# Patient Record
Sex: Male | Born: 1935 | Race: White | Hispanic: No | Marital: Single | Smoking: Never smoker
Health system: Southern US, Community
[De-identification: ages and names within clinical notes are randomized; demographics above are authoritative.]

## PROBLEM LIST (undated history)

## (undated) DIAGNOSIS — C801 Malignant (primary) neoplasm, unspecified: Secondary | ICD-10-CM

## (undated) DIAGNOSIS — I219 Acute myocardial infarction, unspecified: Secondary | ICD-10-CM

---

## 1999-04-28 ENCOUNTER — Ambulatory Visit (HOSPITAL_COMMUNITY): Admission: RE | Admit: 1999-04-28 | Discharge: 1999-04-28 | Payer: Self-pay | Admitting: Surgery

## 1999-04-28 ENCOUNTER — Encounter: Payer: Self-pay | Admitting: Surgery

## 1999-10-25 ENCOUNTER — Ambulatory Visit (HOSPITAL_COMMUNITY): Admission: RE | Admit: 1999-10-25 | Discharge: 1999-10-25 | Payer: Self-pay | Admitting: Surgery

## 1999-12-02 ENCOUNTER — Encounter: Payer: Self-pay | Admitting: Surgery

## 1999-12-03 ENCOUNTER — Observation Stay (HOSPITAL_COMMUNITY): Admission: RE | Admit: 1999-12-03 | Discharge: 1999-12-05 | Payer: Self-pay | Admitting: Surgery

## 2000-01-18 ENCOUNTER — Encounter: Payer: Self-pay | Admitting: *Deleted

## 2000-01-18 ENCOUNTER — Ambulatory Visit (HOSPITAL_COMMUNITY): Admission: RE | Admit: 2000-01-18 | Discharge: 2000-01-18 | Payer: Self-pay | Admitting: *Deleted

## 2002-09-29 ENCOUNTER — Encounter: Payer: Self-pay | Admitting: Emergency Medicine

## 2002-09-29 ENCOUNTER — Emergency Department (HOSPITAL_COMMUNITY): Admission: EM | Admit: 2002-09-29 | Discharge: 2002-09-29 | Payer: Self-pay | Admitting: Emergency Medicine

## 2002-09-30 ENCOUNTER — Emergency Department (HOSPITAL_COMMUNITY): Admission: EM | Admit: 2002-09-30 | Discharge: 2002-09-30 | Payer: Self-pay

## 2004-08-24 ENCOUNTER — Encounter: Payer: Self-pay | Admitting: Internal Medicine

## 2004-09-07 ENCOUNTER — Encounter: Payer: Self-pay | Admitting: Internal Medicine

## 2005-05-05 ENCOUNTER — Ambulatory Visit: Payer: Self-pay | Admitting: Gastroenterology

## 2005-05-26 ENCOUNTER — Ambulatory Visit: Payer: Self-pay | Admitting: Gastroenterology

## 2005-06-17 ENCOUNTER — Ambulatory Visit: Payer: Self-pay

## 2005-07-01 ENCOUNTER — Ambulatory Visit: Payer: Self-pay | Admitting: Surgery

## 2005-07-05 ENCOUNTER — Encounter: Admission: RE | Admit: 2005-07-05 | Discharge: 2005-07-05 | Payer: Self-pay | Admitting: Surgery

## 2005-07-06 ENCOUNTER — Emergency Department: Payer: Self-pay | Admitting: Emergency Medicine

## 2005-10-24 ENCOUNTER — Inpatient Hospital Stay: Payer: Self-pay | Admitting: Internal Medicine

## 2007-01-01 ENCOUNTER — Ambulatory Visit: Payer: Self-pay | Admitting: Internal Medicine

## 2007-04-04 ENCOUNTER — Encounter: Payer: Self-pay | Admitting: Cardiology

## 2007-04-08 ENCOUNTER — Encounter: Payer: Self-pay | Admitting: Cardiology

## 2007-05-08 ENCOUNTER — Encounter: Payer: Self-pay | Admitting: Cardiology

## 2007-06-08 ENCOUNTER — Encounter: Payer: Self-pay | Admitting: Cardiology

## 2007-07-09 ENCOUNTER — Encounter: Payer: Self-pay | Admitting: Cardiology

## 2007-10-20 ENCOUNTER — Other Ambulatory Visit: Payer: Self-pay

## 2007-10-20 ENCOUNTER — Inpatient Hospital Stay: Payer: Self-pay | Admitting: Internal Medicine

## 2007-11-16 ENCOUNTER — Ambulatory Visit: Payer: Self-pay | Admitting: Urology

## 2007-11-21 ENCOUNTER — Ambulatory Visit: Payer: Self-pay | Admitting: Urology

## 2009-05-06 ENCOUNTER — Ambulatory Visit: Payer: Self-pay | Admitting: Unknown Physician Specialty

## 2009-12-17 ENCOUNTER — Inpatient Hospital Stay: Payer: Self-pay | Admitting: Internal Medicine

## 2009-12-22 ENCOUNTER — Encounter: Payer: Self-pay | Admitting: Internal Medicine

## 2010-02-04 ENCOUNTER — Encounter: Payer: Self-pay | Admitting: Internal Medicine

## 2010-02-05 ENCOUNTER — Encounter: Payer: Self-pay | Admitting: Internal Medicine

## 2010-03-07 ENCOUNTER — Encounter: Payer: Self-pay | Admitting: Internal Medicine

## 2010-04-07 ENCOUNTER — Encounter: Payer: Self-pay | Admitting: Internal Medicine

## 2010-06-11 ENCOUNTER — Ambulatory Visit: Payer: Self-pay | Admitting: Physician Assistant

## 2010-11-19 ENCOUNTER — Ambulatory Visit: Payer: Self-pay | Admitting: Internal Medicine

## 2010-11-28 ENCOUNTER — Encounter: Payer: Self-pay | Admitting: Surgery

## 2011-03-02 ENCOUNTER — Observation Stay: Payer: Self-pay | Admitting: Internal Medicine

## 2011-08-10 ENCOUNTER — Ambulatory Visit: Payer: Self-pay | Admitting: Gastroenterology

## 2011-10-03 ENCOUNTER — Inpatient Hospital Stay: Payer: Self-pay | Admitting: Internal Medicine

## 2012-02-02 ENCOUNTER — Ambulatory Visit: Payer: Self-pay | Admitting: Neurosurgery

## 2012-05-20 ENCOUNTER — Emergency Department: Payer: Self-pay | Admitting: Emergency Medicine

## 2012-05-20 LAB — URINALYSIS, COMPLETE
Bacteria: NONE SEEN
Bilirubin,UR: NEGATIVE
Blood: NEGATIVE
Glucose,UR: NEGATIVE mg/dL (ref 0–75)
Hyaline Cast: 3
Ketone: NEGATIVE
Specific Gravity: 1.009 (ref 1.003–1.030)
Squamous Epithelial: <1

## 2012-05-20 LAB — COMPREHENSIVE METABOLIC PANEL
Albumin: 3.2 g/dL — ABNORMAL LOW (ref 3.4–5.0)
Alkaline Phosphatase: 58 U/L (ref 50–136)
Anion Gap: 10 (ref 7–16)
BUN: 11 mg/dL (ref 7–18)
Bilirubin,Total: 0.7 mg/dL (ref 0.2–1.0)
Calcium, Total: 8.6 mg/dL (ref 8.5–10.1)
Chloride: 103 mmol/L (ref 98–107)
Co2: 26 mmol/L (ref 21–32)
Creatinine: 0.92 mg/dL (ref 0.60–1.30)
EGFR (African American): >60
EGFR (Non-African Amer.): >60
Glucose: 115 mg/dL — ABNORMAL HIGH (ref 65–99)
Osmolality: 278 (ref 275–301)
Potassium: 4 mmol/L (ref 3.5–5.1)
SGOT(AST): 20 U/L (ref 15–37)
SGPT (ALT): 14 U/L
Sodium: 139 mmol/L (ref 136–145)
Total Protein: 6.5 g/dL (ref 6.4–8.2)

## 2012-05-20 LAB — CBC
HCT: 33.2 % — ABNORMAL LOW (ref 40.0–52.0)
HGB: 11.3 g/dL — ABNORMAL LOW (ref 13.0–18.0)
MCH: 33 pg (ref 26.0–34.0)
MCHC: 34 g/dL (ref 32.0–36.0)
MCV: 97 fL (ref 80–100)
Platelet: 155 10*3/uL (ref 150–440)
RBC: 3.42 10*6/uL — ABNORMAL LOW (ref 4.40–5.90)
RDW: 14.1 % (ref 11.5–14.5)
WBC: 5.9 10*3/uL (ref 3.8–10.6)

## 2012-05-20 LAB — TROPONIN I: Troponin-I: <0.02 ng/mL

## 2012-05-20 LAB — CK TOTAL AND CKMB (NOT AT ARMC)
CK, Total: 60 U/L (ref 35–232)
CK-MB: 1.3 ng/mL (ref 0.5–3.6)

## 2012-06-06 ENCOUNTER — Emergency Department: Payer: Self-pay | Admitting: Emergency Medicine

## 2012-06-06 LAB — TROPONIN I
Troponin-I: <0.02 ng/mL
Troponin-I: <0.02 ng/mL

## 2012-06-06 LAB — CBC WITH DIFFERENTIAL/PLATELET
Basophil #: 0 10*3/uL (ref 0.0–0.1)
Eosinophil #: 0.1 10*3/uL (ref 0.0–0.7)
Eosinophil %: 1.1 %
Lymphocyte #: 2.5 10*3/uL (ref 1.0–3.6)
MCH: 33.3 pg (ref 26.0–34.0)
MCV: 96 fL (ref 80–100)
Monocyte #: 0.4 x10 3/mm (ref 0.2–1.0)
Neutrophil #: 3.7 10*3/uL (ref 1.4–6.5)
Platelet: 171 10*3/uL (ref 150–440)
RDW: 14.7 % — ABNORMAL HIGH (ref 11.5–14.5)

## 2012-06-06 LAB — COMPREHENSIVE METABOLIC PANEL
Albumin: 3.5 g/dL (ref 3.4–5.0)
Alkaline Phosphatase: 57 U/L (ref 50–136)
BUN: 13 mg/dL (ref 7–18)
Calcium, Total: 8.9 mg/dL (ref 8.5–10.1)
Co2: 26 mmol/L (ref 21–32)
EGFR (African American): >60
EGFR (Non-African Amer.): >60
Glucose: 111 mg/dL — ABNORMAL HIGH (ref 65–99)
Potassium: 3.9 mmol/L (ref 3.5–5.1)
SGOT(AST): 20 U/L (ref 15–37)
SGPT (ALT): 15 U/L
Total Protein: 7 g/dL (ref 6.4–8.2)

## 2012-06-06 LAB — TSH: Thyroid Stimulating Horm: 2.73 u[IU]/mL

## 2012-08-14 ENCOUNTER — Ambulatory Visit: Payer: Self-pay | Admitting: Internal Medicine

## 2012-08-16 ENCOUNTER — Ambulatory Visit: Payer: Self-pay | Admitting: Internal Medicine

## 2015-09-14 ENCOUNTER — Emergency Department: Payer: Medicare PPO

## 2015-09-14 ENCOUNTER — Encounter: Payer: Self-pay | Admitting: *Deleted

## 2015-09-14 ENCOUNTER — Inpatient Hospital Stay: Payer: Medicare PPO

## 2015-09-14 ENCOUNTER — Inpatient Hospital Stay
Admission: EM | Admit: 2015-09-14 | Discharge: 2015-09-16 | DRG: 312 | Disposition: A | Discharge status: Home / Self Care | Payer: Medicare PPO | Attending: Internal Medicine | Admitting: Internal Medicine

## 2015-09-14 DIAGNOSIS — J189 Pneumonia, unspecified organism: Secondary | ICD-10-CM | POA: Diagnosis present

## 2015-09-14 DIAGNOSIS — Y92009 Unspecified place in unspecified non-institutional (private) residence as the place of occurrence of the external cause: Secondary | ICD-10-CM | POA: Diagnosis not present

## 2015-09-14 DIAGNOSIS — N400 Enlarged prostate without lower urinary tract symptoms: Secondary | ICD-10-CM | POA: Diagnosis present

## 2015-09-14 DIAGNOSIS — W19XXXA Unspecified fall, initial encounter: Secondary | ICD-10-CM | POA: Diagnosis present

## 2015-09-14 DIAGNOSIS — I252 Old myocardial infarction: Secondary | ICD-10-CM | POA: Diagnosis not present

## 2015-09-14 DIAGNOSIS — Z66 Do not resuscitate: Secondary | ICD-10-CM | POA: Diagnosis present

## 2015-09-14 DIAGNOSIS — J849 Interstitial pulmonary disease, unspecified: Secondary | ICD-10-CM | POA: Diagnosis present

## 2015-09-14 DIAGNOSIS — E860 Dehydration: Secondary | ICD-10-CM | POA: Diagnosis present

## 2015-09-14 DIAGNOSIS — Z7982 Long term (current) use of aspirin: Secondary | ICD-10-CM | POA: Diagnosis not present

## 2015-09-14 DIAGNOSIS — R55 Syncope and collapse: Secondary | ICD-10-CM | POA: Diagnosis present

## 2015-09-14 DIAGNOSIS — Z8546 Personal history of malignant neoplasm of prostate: Secondary | ICD-10-CM | POA: Diagnosis not present

## 2015-09-14 DIAGNOSIS — E538 Deficiency of other specified B group vitamins: Secondary | ICD-10-CM | POA: Diagnosis present

## 2015-09-14 DIAGNOSIS — R079 Chest pain, unspecified: Secondary | ICD-10-CM

## 2015-09-14 DIAGNOSIS — E785 Hyperlipidemia, unspecified: Secondary | ICD-10-CM | POA: Diagnosis present

## 2015-09-14 DIAGNOSIS — E871 Hypo-osmolality and hyponatremia: Secondary | ICD-10-CM | POA: Diagnosis present

## 2015-09-14 DIAGNOSIS — Z8673 Personal history of transient ischemic attack (TIA), and cerebral infarction without residual deficits: Secondary | ICD-10-CM

## 2015-09-14 DIAGNOSIS — I10 Essential (primary) hypertension: Secondary | ICD-10-CM | POA: Diagnosis present

## 2015-09-14 DIAGNOSIS — Z79899 Other long term (current) drug therapy: Secondary | ICD-10-CM

## 2015-09-14 DIAGNOSIS — H9190 Unspecified hearing loss, unspecified ear: Secondary | ICD-10-CM | POA: Diagnosis present

## 2015-09-14 HISTORY — DX: Malignant (primary) neoplasm, unspecified: C80.1

## 2015-09-14 HISTORY — DX: Acute myocardial infarction, unspecified: I21.9

## 2015-09-14 LAB — TROPONIN I: Troponin I: <0.03 ng/mL (ref <0.031)

## 2015-09-14 LAB — COMPREHENSIVE METABOLIC PANEL
ALBUMIN: 3.1 g/dL — AB (ref 3.5–5.0)
ALT: 14 U/L — AB (ref 17–63)
AST: 35 U/L (ref 15–41)
Alkaline Phosphatase: 37 U/L — ABNORMAL LOW (ref 38–126)
Anion gap: 5 (ref 5–15)
BILIRUBIN TOTAL: 0.8 mg/dL (ref 0.3–1.2)
BUN: 12 mg/dL (ref 6–20)
CHLORIDE: 101 mmol/L (ref 101–111)
CO2: 26 mmol/L (ref 22–32)
CREATININE: 0.79 mg/dL (ref 0.61–1.24)
Calcium: 8.5 mg/dL — ABNORMAL LOW (ref 8.9–10.3)
GFR calc Af Amer: >60 mL/min (ref >60)
GFR calc non Af Amer: >60 mL/min (ref >60)
GLUCOSE: 169 mg/dL — AB (ref 65–99)
POTASSIUM: 4.1 mmol/L (ref 3.5–5.1)
Sodium: 132 mmol/L — ABNORMAL LOW (ref 135–145)
TOTAL PROTEIN: 6.1 g/dL — AB (ref 6.5–8.1)

## 2015-09-14 LAB — PROTIME-INR
INR: 1.12
Prothrombin Time: 14.6 seconds (ref 11.4–15.0)

## 2015-09-14 LAB — CREATININE, SERUM: CREATININE: 0.74 mg/dL (ref 0.61–1.24)

## 2015-09-14 LAB — CBC
HCT: 32.9 % — ABNORMAL LOW (ref 40.0–52.0)
HCT: 31.8 % — ABNORMAL LOW (ref 40.0–52.0)
HEMOGLOBIN: 11.2 g/dL — AB (ref 13.0–18.0)
HEMOGLOBIN: 10.8 g/dL — AB (ref 13.0–18.0)
MCH: 36 pg — ABNORMAL HIGH (ref 26.0–34.0)
MCH: 36.1 pg — AB (ref 26.0–34.0)
MCHC: 34 g/dL (ref 32.0–36.0)
MCHC: 34 g/dL (ref 32.0–36.0)
MCV: 105.8 fL — ABNORMAL HIGH (ref 80.0–100.0)
MCV: 106.2 fL — ABNORMAL HIGH (ref 80.0–100.0)
PLATELETS: 122 10*3/uL — AB (ref 150–440)
PLATELETS: 117 10*3/uL — AB (ref 150–440)
RBC: 3.11 MIL/uL — ABNORMAL LOW (ref 4.40–5.90)
RBC: 3 MIL/uL — AB (ref 4.40–5.90)
RDW: 14.8 % — ABNORMAL HIGH (ref 11.5–14.5)
RDW: 14.7 % — ABNORMAL HIGH (ref 11.5–14.5)
WBC: 4.6 10*3/uL (ref 3.8–10.6)
WBC: 3.4 10*3/uL — AB (ref 3.8–10.6)

## 2015-09-14 LAB — TYPE AND SCREEN
ABO/RH(D): A POS
ANTIBODY SCREEN: NEGATIVE

## 2015-09-14 LAB — GLUCOSE, CAPILLARY: Glucose-Capillary: 138 mg/dL — ABNORMAL HIGH (ref 65–99)

## 2015-09-14 LAB — ABO/RH: ABO/RH(D): A POS

## 2015-09-14 MED ORDER — SENNOSIDES-DOCUSATE SODIUM 8.6-50 MG PO TABS: 1.0000 | ORAL_TABLET | Freq: Every evening | ORAL | Status: DC | PRN

## 2015-09-14 MED ORDER — NITROGLYCERIN 2 % TD OINT
1.0000 [in_us] | TOPICAL_OINTMENT | Freq: Once | TRANSDERMAL | Status: AC
  Administered 2015-09-14: 1 [in_us] via TOPICAL
  Filled 2015-09-14: qty 1

## 2015-09-14 MED ORDER — HYDROCODONE-ACETAMINOPHEN 5-325 MG PO TABS: 1.0000 | ORAL_TABLET | ORAL | Status: DC | PRN

## 2015-09-14 MED ORDER — SODIUM CHLORIDE 0.9 % IV SOLN
INTRAVENOUS | Status: DC
  Administered 2015-09-14 – 2015-09-16 (×3): via INTRAVENOUS

## 2015-09-14 MED ORDER — DEXTROSE 5 % IV SOLN
1.0000 g | INTRAVENOUS | Status: DC
  Administered 2015-09-14: 1 g via INTRAVENOUS
  Filled 2015-09-14: qty 10

## 2015-09-14 MED ORDER — ACETAMINOPHEN 325 MG PO TABS: 650.0000 mg | ORAL_TABLET | Freq: Four times a day (QID) | ORAL | Status: DC | PRN

## 2015-09-14 MED ORDER — TEMAZEPAM 15 MG PO CAPS
30.0000 mg | ORAL_CAPSULE | Freq: Every evening | ORAL | Status: DC | PRN
  Administered 2015-09-15 – 2015-09-16 (×2): 30 mg via ORAL
  Filled 2015-09-14 (×2): qty 2

## 2015-09-14 MED ORDER — TAMSULOSIN HCL 0.4 MG PO CAPS
0.4000 mg | ORAL_CAPSULE | Freq: Every day | ORAL | Status: DC
  Administered 2015-09-14 – 2015-09-16 (×3): 0.4 mg via ORAL
  Filled 2015-09-14 (×3): qty 1

## 2015-09-14 MED ORDER — MORPHINE SULFATE (PF) 4 MG/ML IV SOLN
4.0000 mg | Freq: Once | INTRAVENOUS | Status: AC
  Administered 2015-09-14: 4 mg via INTRAVENOUS
  Filled 2015-09-14: qty 1

## 2015-09-14 MED ORDER — ACETAMINOPHEN 650 MG RE SUPP
650.0000 mg | Freq: Four times a day (QID) | RECTAL | Status: DC | PRN
Start: 2015-09-14 — End: 2015-09-16

## 2015-09-14 MED ORDER — IOHEXOL 350 MG/ML SOLN
75.0000 mL | Freq: Once | INTRAVENOUS | Status: AC | PRN
  Administered 2015-09-14: 75 mL via INTRAVENOUS

## 2015-09-14 MED ORDER — OXYCODONE-ACETAMINOPHEN 7.5-325 MG PO TABS: 1.0000 | ORAL_TABLET | ORAL | Status: DC | PRN

## 2015-09-14 MED ORDER — AZITHROMYCIN 250 MG PO TABS
500.0000 mg | ORAL_TABLET | Freq: Every day | ORAL | Status: AC
  Administered 2015-09-14: 500 mg via ORAL
  Filled 2015-09-14: qty 2

## 2015-09-14 MED ORDER — MORPHINE SULFATE (PF) 2 MG/ML IV SOLN
2.0000 mg | Freq: Once | INTRAVENOUS | Status: AC
  Administered 2015-09-14: 2 mg via INTRAVENOUS
  Filled 2015-09-14: qty 1

## 2015-09-14 MED ORDER — AZITHROMYCIN 250 MG PO TABS
250.0000 mg | ORAL_TABLET | Freq: Every day | ORAL | Status: DC
  Administered 2015-09-15: 250 mg via ORAL
  Filled 2015-09-14: qty 1

## 2015-09-14 MED ORDER — BISACODYL 5 MG PO TBEC: 5.0000 mg | DELAYED_RELEASE_TABLET | Freq: Every day | ORAL | Status: DC | PRN

## 2015-09-14 MED ORDER — ENOXAPARIN SODIUM 40 MG/0.4ML ~~LOC~~ SOLN
40.0000 mg | SUBCUTANEOUS | Status: DC
  Administered 2015-09-14 – 2015-09-15 (×2): 40 mg via SUBCUTANEOUS
  Filled 2015-09-14 (×2): qty 0.4

## 2015-09-14 MED ORDER — FOLIC ACID 1 MG PO TABS
1.0000 mg | ORAL_TABLET | Freq: Every day | ORAL | Status: DC
  Administered 2015-09-14 – 2015-09-16 (×3): 1 mg via ORAL
  Filled 2015-09-14 (×3): qty 1

## 2015-09-14 NOTE — ED Provider Notes (Addendum)
[facility] Emergency Department Provider Note     Time seen: ----------------------------------------- 11:43 AM on 09/14/2015 -----------------------------------------  Level V caveat: Review of systems and history is limited by altered mental status  I have reviewed the triage vital signs and the nursing notes.   HISTORY  Chief Complaint Chest Pain; Fall; and Weakness    HPI Nicholas Osborn is a 81 y.o. male brought in by EMS after he became weak and fell and hit his head. Patient was also having chest pain this morning he describes as heaviness he has had some nausea is also noted that he had some black stools or passed a blood clot in the stool recently. He does not have a history of this before.   No past medical history on file.  There are no active problems to display for this patient.   No past surgical history on file.  Allergies Review of patient's allergies indicates not on file.  Social History Social History  Substance Use Topics  . Smoking status: Not on file  . Smokeless tobacco: Not on file  . Alcohol Use: Not on file    Review of Systems Constitutional: Negative for fever. Eyes: Negative for visual changes. ENT: Negative for sore throat. Cardiovascular: Positive for chest pain Respiratory: As to for shortness of breath Gastrointestinal: Negative for abdominal pain, vomiting and diarrhea. Positive for black or bloody stools Genitourinary: Negative for dysuria. Musculoskeletal: Negative for back pain. Skin: positive for pallor Neurological: Negative for headaches, positive for weakness  10-point ROS otherwise negative.  ____________________________________________   PHYSICAL EXAM:  VITAL SIGNS: ED Triage Vitals  Enc Vitals Group     BP --      Pulse --      Resp --      Temp --      Temp src --      SpO2 --      Weight --      Height --      Head Cir --      Peak Flow --      Pain Score --      Pain Loc  --      Pain Edu? --      Excl. in [place]? --     Constitutional: Alert but drowsy, mild distress Eyes: Conjunctivae are pale. PERRL. Normal extraocular movements. ENT   Head: Normocephalic and atraumatic.   Nose: No congestion/rhinnorhea.   Mouth/Throat: Mucous membranes are moist.   Neck: No stridor. Cardiovascular: Normal rate, regular rhythm. 2/6 systolic murmur. Good distal pulses. Respiratory: Normal respiratory effort without tachypnea nor retractions. Breath sounds are clear and equal bilaterally. No wheezes/rales/rhonchi. Gastrointestinal: Soft and nontender. No distention. No abdominal bruits.  Musculoskeletal: Nontender with normal range of motion in all extremities. No joint effusions.  No lower extremity tenderness nor edema. Neurologic:  Normal speech and language. Her last weakness, nothing focal. Skin:  Skin is warm, dry and intact. [place] pallor is noted. Psychiatric: Mood and affect are normal. Speech and behavior are normal. Patient exhibits appropriate insight and judgment. ____________________________________________  EKG: Interpreted by me. Sinus rhythm rate of 54 bpm, long PR interval, right bundle branch block, LVH.  ____________________________________________  ED COURSE:  Pertinent labs & imaging results that were available during my care of the patient were reviewed by me and considered in my medical decision making (see chart for details). Patient appears ill, pale and weak. Will check cardiac labs and reevaluate. ____________________________________________    LABS (  pertinent positives/negatives)  Labs Reviewed  CBC - Abnormal; Notable for the following:    RBC 3.11 (*)    Hemoglobin 11.2 (*)    HCT 32.9 (*)    MCV 105.8 (*)    MCH 36.0 (*)    RDW 14.8 (*)    Platelets 122 (*)    All other components within normal limits  COMPREHENSIVE METABOLIC PANEL - Abnormal; Notable for the following:    Sodium 132 (*)    Glucose, Bld 169 (*)     Calcium 8.5 (*)    Total Protein 6.1 (*)    Albumin 3.1 (*)    ALT 14 (*)    Alkaline Phosphatase 37 (*)    All other components within normal limits  GLUCOSE, CAPILLARY - Abnormal; Notable for the following:    Glucose-Capillary 138 (*)    All other components within normal limits  TROPONIN I  PROTIME-INR  TYPE AND SCREEN  ABO/RH    RADIOLOGY Images were viewed by me  Chest x-ray IMPRESSION: Increased interstitial density throughout the left lung may reflect the sequelae of aspiration. Non aspiration pneumonia, pulmonary contusion, and asymmetric pulmonary edema are felt less likely. The observed bony thorax exhibits no acute fracture. ____________________________________________  FINAL ASSESSMENT AND PLAN  Chest pain, syncope  Plan: Patient with labs and imaging as dictated above. Patient with normal CT imaging of the brain. He'll be observed to ensure no signs of aspiration pneumonia. Patient resist taken an adult aspirin, he's received nitroglycerin and morphine for her symptoms. Troponins at this point is negative but he needs telemetry and serial troponin.   Earleen Newport, MD   Earleen Newport, MD 09/14/15 [place], MD 09/14/15 [phone]

## 2015-09-14 NOTE — ED Notes (Signed)
Pt placed on 2L [place]

## 2015-09-14 NOTE — ED Notes (Signed)
Pt ready for admission.   Waiting for tech transport.

## 2015-09-14 NOTE — ED Notes (Signed)
Patient transported to CT 

## 2015-09-14 NOTE — H&P (Signed)
[facility] [place] NAME: Nicholas Osborn    MR#:  [id number]  DATE OF BIRTH:  [birth year]  DATE OF ADMISSION:  09/14/2015  PRIMARY CARE PHYSICIAN: Dr Elta Guadeloupe Sabra Heck  REQUESTING/REFERRING PHYSICIAN: Dr Jimmye Norman  CHIEF COMPLAINT:  I passed out today  HISTORY OF PRESENT ILLNESS:  Nicholas Osborn  is a 81 y.o. male with a known history of left pontine CVA, history of MI, impaired hearing, B12 deficiency, history of prostate cancer, hyperlipidemia, comes to the emergency room after he had a syncopal episode today at home. Patient reports walking halfway through the bathroom with his cane and next thing he found was unresponsive was being attended by EMS. He was given sublingual nitroglycerin was brought to the emergency room. He is hemodynamically stable how work he is feeling quite weak and had some shortness of breath. History chest x-ray shows interstitial infiltrate in the left lung. Patient denies any chest pain fever or productive phlegm. His white count is normal. He is being admitted for further evaluation of management of his syncope and abnormal chest x-ray  PAST MEDICAL HISTORY:   Past Medical History  Diagnosis Date  . MI (myocardial infarction) ([place])   . Cancer [facility])     PAST SURGICAL HISTOIRY:  Coronary angioplasty L5 laminectomy Niesen fundoplication 3 0086 LAD stent 9 2005 LAD stent 2 in 2007  L 2-4 laminectomy 12/2009 Perforated ulcer surgery EGD showed gastritis in 6 2010  SOCIAL HISTORY:   Social History  Substance Use Topics  . Smoking status: Never Smoker   . Smokeless tobacco: Not on file  . Alcohol Use: Not on file    FAMILY HISTORY:  History reviewed. No pertinent family history.  DRUG ALLERGIES:  No Known Allergies  REVIEW OF SYSTEMS:  Review of Systems  Constitutional: Positive for malaise/fatigue. Negative for fever, chills and weight loss.  HENT: Negative for ear discharge, ear pain and  nosebleeds.   Eyes: Negative for blurred vision, pain and discharge.  Respiratory: Positive for shortness of breath. Negative for sputum production, wheezing and stridor.   Cardiovascular: Negative for chest pain, palpitations, orthopnea and PND.  Gastrointestinal: Negative for nausea, vomiting, abdominal pain and diarrhea.  Genitourinary: Negative for urgency and frequency.  Musculoskeletal: Negative for back pain and joint pain.  Neurological: Positive for weakness. Negative for sensory change, speech change and focal weakness.  Psychiatric/Behavioral: Negative for depression and hallucinations. The patient is not nervous/anxious.   All other systems reviewed and are negative.    MEDICATIONS AT HOME:   Prior to Admission medications   Medication Sig Start Date End Date Taking? Authorizing Provider  clotrimazole-betamethasone (LOTRISONE) cream apply topically once daily if needed 08/18/15  Yes Historical Provider, MD  folic acid (FOLVITE) 1 MG tablet Take 1 mg by mouth daily. 09/06/15  Yes Historical Provider, MD  nitroGLYCERIN (NITRODUR - DOSED IN MG/24 HR) 0.1 mg/hr patch Place 0.1 mg onto the skin daily. Leave on for 12-14 hours and then remove for 10-12 08/24/15  Yes Historical Provider, MD  oxyCODONE-acetaminophen (PERCOCET) 7.5-325 MG tablet Take 1 tablet by mouth 4 (four) times daily. 09/09/15  Yes Historical Provider, MD  tamsulosin (FLOMAX) 0.4 MG CAPS capsule Take 1 capsule by mouth daily. 09/06/15  Yes Historical Provider, MD  temazepam (RESTORIL) 30 MG capsule Take 1 capsule by mouth at bedtime as needed. 08/20/15  Yes Historical Provider, MD      VITAL SIGNS:  Blood pressure 109/53, pulse 57, temperature 97.5 F (  36.4 C), temperature source Axillary, resp. rate 16, height 5\' 7"  (1.702 m), weight 58.514 kg (129 lb), SpO2 98 %.  PHYSICAL EXAMINATION:  GENERAL:  81 y.o.-year-old patient lying in the bed with no acute distress. Impaired hearing  EYES: Pupils equal, round,  reactive to light and accommodation. No scleral icterus. Extraocular muscles intact.  HEENT: Head atraumatic, normocephalic. Oropharynx and nasopharynx clear.  NECK:  Supple, no jugular venous distention. No thyroid enlargement, no tenderness.  LUNGS: Normal breath sounds bilaterally, no wheezing, rales,rhonchi or crepitation. No use of accessory muscles of respiration.  CARDIOVASCULAR: S1, S2 normal. No murmurs, rubs, or gallops.  ABDOMEN: Soft, nontender, nondistended. Bowel sounds present. No organomegaly or mass.  EXTREMITIES: No pedal edema, cyanosis, or clubbing.  NEUROLOGIC: Cranial nerves II through XII are intact. Muscle strength 5/5 in all extremities. Sensation intact. Gait not checked.  PSYCHIATRIC: The patient is alert and oriented x 3.  SKIN: No obvious rash, lesion, or ulcer.   LABORATORY PANEL:   CBC  Recent Labs Lab 09/14/15 1147  WBC 4.6  HGB 11.2*  HCT 32.9*  PLT 122*   ------------------------------------------------------------------------------------------------------------------  Chemistries   Recent Labs Lab 09/14/15 1147  NA 132*  K 4.1  CL 101  CO2 26  GLUCOSE 169*  BUN 12  CREATININE 0.79  CALCIUM 8.5*  AST 35  ALT 14*  ALKPHOS 37*  BILITOT 0.8   ------------------------------------------------------------------------------------------------------------------  Cardiac Enzymes  Recent Labs Lab 09/14/15 1147  TROPONINI <0.03   ------------------------------------------------------------------------------------------------------------------  RADIOLOGY:  Ct Head Wo Contrast  09/14/2015  CLINICAL DATA:  Fall, right head injury EXAM: CT HEAD WITHOUT CONTRAST TECHNIQUE: Contiguous axial images were obtained from the base of the skull through the vertex without intravenous contrast. COMPARISON:  06/06/2012 FINDINGS: No evidence of parenchymal hemorrhage or extra-axial fluid collection. No mass lesion, mass effect, or midline shift. No CT  evidence of acute infarction. Subcortical white matter and periventricular small vessel ischemic changes. Intracranial atherosclerosis. Global cortical atrophy. No ventriculomegaly. The visualized paranasal sinuses are essentially clear. The mastoid air cells are unopacified. No evidence of calvarial fracture. IMPRESSION: No evidence of acute intracranial abnormality. Atrophy with small vessel ischemic changes. Electronically Signed   By: Julian Hy M.D.   On: 09/14/2015 13:05   Ct Angio Chest Pe W/cm &/or Wo Cm  09/14/2015  CLINICAL DATA:  Shortness of breath, syncope, chest pain. EXAM: CT ANGIOGRAPHY CHEST WITH CONTRAST TECHNIQUE: Multidetector CT imaging of the chest was performed using the standard protocol during bolus administration of intravenous contrast. Multiplanar CT image reconstructions and MIPs were obtained to evaluate the vascular anatomy. CONTRAST:  31mL OMNIPAQUE IOHEXOL 350 MG/ML SOLN COMPARISON:  Chest x-ray earlier today. FINDINGS: No filling defects in the pulmonary arteries to suggest pulmonary emboli. Heart is normal size. Aorta is normal caliber. Densely calcified coronary artery. Calcifications throughout the aorta. No mediastinal, hilar, or axillary adenopathy. Diffuse interstitial prominence throughout the left lung, with similar peripheral interstitial prominence throughout the right lung. Volume loss on the left with shift of mediastinal structures to the left. No pleural effusion. Chest wall soft tissues are unremarkable. Imaging into the upper abdomen shows no acute findings. Layering gallstones within the gallbladder. Review of the MIP images confirms the above findings. IMPRESSION: No evidence of pulmonary embolus. Coronary artery disease. Diffuse interstitial thickening throughout the left lung and peripherally in the right lung. Volume loss on the left. I favor this represents chronic interstitial lung disease and fibrosis. Given the asymmetry, a superimposed acute  process on  the low left cannot be excluded such as infection or less likely asymmetric edema. Cholelithiasis. Electronically Signed   By: Rolm Baptise M.D.   On: 09/14/2015 15:04   Dg Chest Port 1 View  09/14/2015  CLINICAL DATA:  Status post fall today striking the head; onset of mid sternal chest pain today ; the patient was found unresponsive by the fire department. EXAM: PORTABLE CHEST 1 VIEW COMPARISON:  Portable chest x-ray of June 06, 2012 FINDINGS: There is abnormal increased interstitial density throughout the left lung. There is mild shift of the mediastinum toward the left. The left heart border is partially obscured. The right lung is mildly hyperinflated but clear. No pleural effusion or pneumothorax is observed. The observed portions of the bony thorax are normal. IMPRESSION: Increased interstitial density throughout the left lung may reflect the sequelae of aspiration. Non aspiration pneumonia, pulmonary contusion, and asymmetric pulmonary edema are felt less likely. The observed bony thorax exhibits no acute fracture. Electronically Signed   By: David  Martinique M.D.   On: 09/14/2015 12:33    EKG:   Sinus rhythm with prolonged PR, R BBB, LVH  IMPRESSION AND PLAN:   Taft Worthing  is a 81 y.o. male with a known history of left pontine CVA, history of MI, impaired hearing, B12 deficiency, history of prostate cancer, hyperlipidemia, comes to the emergency room after he had a syncopal episode today at home. Patient reports walking halfway through the bathroom with his cane and next thing he found was unresponsive was being attended by EMS.  1. Syncopal episode etiology unclear Patient appears weak. Blood pressure stable. Clinically mild dehydration Admit to telemetry floor IV fluids for hydration Monitor for arrhythmias PT to work for gait and ambulation  2. Abnormal chest x-ray/CT chest Patient does not have any respiratory symptoms. He has mild chest discomfort. Looks like chronic  interstitial fibrosis however I do not have any old chest x-ray or CT scan to compare to Empiric zithromax for possible low-grade lung infection Outpatient pulmonary follow-up Assessment home oxygen need. Patient's sats are 96% on room air  3. History of CVA patient uses a cane to walk. PT to eval  start patient on aspirin 81 mg daily  4. History of hypertension blood pressure is stable not on any antihypertensives at home we'll continue to monitor  5. BPH continue Flomax  6. DVT prophylaxis subcutaneous Lovenox    All the records are reviewed and case discussed with ED provider. Management plans discussed with the patient, family and they are in agreement.  CODE STATUS: DO NOT RESUSCITATE per patient  TOTAL TIME TAKING CARE OF THIS PATIENT: 50  minutes.    Shontez Sermon M.D on 09/14/2015 at 3:13 PM  Between 7am to 6pm - Pager - [phone]  After 6pm go to [email or web address] - password [email or web address] [facility]  [facility]  Office  [phone]  CC: Primary care physician; No primary care provider on file.

## 2015-09-14 NOTE — ED Notes (Signed)
Pt arrives via EMS from home, pt fell going from wheelchair to bathroom and states an episode of chest pain before he fell, Fire depatment states pt was unresponsive upon their arrival, pt took 1 SL nitro and 325 ASA, EMS reports 1 spray of nitro given, pt arrives pale with eyes closed, states he had some dark stools on Saturday and possibly a blood clot passed in his stool

## 2015-09-14 NOTE — ED Notes (Signed)
States this am was gong to bathroom and became weak and fell, hit head, pt pushed button for ems, has mid chest heavinness # 7, some nausea

## 2015-09-15 LAB — URINALYSIS COMPLETE WITH MICROSCOPIC (ARMC ONLY)
BACTERIA UA: NONE SEEN
Bilirubin Urine: NEGATIVE
Glucose, UA: NEGATIVE mg/dL
Hgb urine dipstick: NEGATIVE
Ketones, ur: NEGATIVE mg/dL
Nitrite: NEGATIVE
PROTEIN: NEGATIVE mg/dL
Specific Gravity, Urine: 1.013 (ref 1.005–1.030)
pH: 6 (ref 5.0–8.0)

## 2015-09-15 LAB — TROPONIN I: Troponin I: <0.03 ng/mL (ref <0.031)

## 2015-09-15 MED ORDER — ENSURE ENLIVE PO LIQD
237.0000 mL | Freq: Two times a day (BID) | ORAL | Status: DC
  Administered 2015-09-15 – 2015-09-16 (×2): 237 mL via ORAL

## 2015-09-15 MED ORDER — OXYCODONE-ACETAMINOPHEN 7.5-325 MG PO TABS: 1.0000 | ORAL_TABLET | ORAL | Status: DC | PRN

## 2015-09-15 NOTE — Progress Notes (Signed)
Nicholas Osborn. Room air. Takes meds ok. Urinal. Speech worked with the pt. Pt has not reported any pain. A & O. IV abx. Pt has no further concerns at this time.

## 2015-09-15 NOTE — Evaluation (Signed)
Clinical/Bedside Swallow Evaluation Patient Details  Name: Nicholas Osborn MRN: [id number] Date of Birth: 12/04/1919  Today's Date: 09/15/2015 Time: SLP Start Time (ACUTE ONLY): 1779 SLP Stop Time (ACUTE ONLY): 1310 SLP Time Calculation (min) (ACUTE ONLY): 25 min  Past Medical History:  Past Medical History  Diagnosis Date  . MI (myocardial infarction) ([place])   . Cancer [facility])    Past Surgical History: History reviewed. No pertinent past surgical history. HPI:  Pt states no concerns with swallowing, stated "it's never been a problem" and explains he eats small meals, because he has a small stomach and will vomit if he overeats. Pt manages this with diet (reported). Pt also drinks an ensure daily. HPI: Nicholas Osborn is a 81 y.o. male with a known history of left pontine CVA, history of MI, impaired hearing, B12 deficiency, history of prostate cancer, hyperlipidemia, comes to the emergency room after he had a syncopal episode today at home. Patient reports walking halfway through the bathroom with his cane and next thing he found was unresponsive was being attended by EMS. He was given sublingual nitroglycerin was brought to the emergency room. He is hemodynamically stable how work he is feeling quite weak and had some shortness of breath.   Assessment / Plan / Recommendation Clinical Impression  Pt presented with no overt or immediate s/s of aspiration across all PO trials with ST. Pt reports no concerns with speech, language or swallowing and explained swallowing has "never been a problem." Pt reports baseline condition in which he will vomit if he eats too much based on small stomach size -- he self manages this by eating small meals and supplementing with an Ensure daily (likes vanilla flavor). Pt consumed ~1-2 ounces of thin liquids and half of a chicken salad  sandwich with no overt or immediate s/s of aspiration. Pt took very large bites and exhibited a slightly prolonged mastication time.  Large bites require more cognitive attention - recommend limiting distractions and speaking with Pt while he is eating. Pt agrees that ST services are not required at this time, but was informed we are available if any future concerns arise while he is admitted. NSG updated.   Aspiration Risk   (reduced)    Diet Recommendation Age appropriate regular solids;Thin   Medication Administration: Whole meds with liquid (puree as needed) Compensations: Slow rate;Small sips/bites;Minimize environmental distractions    Other  Recommendations Oral Care Recommendations: Oral care BID;Patient independent with oral care   Follow Up Recommendations       Frequency and Duration        Pertinent Vitals/Pain None indicated    SLP Swallow Goals  N/A   Swallow Study Prior Functional Status   Lives independently at home    General Date of Onset: 09/14/15 Other Pertinent Information: Pt states no concerns with swallowing, stated "it's never been a problem" and explains he eats small meals, because he has a small stomach and will vomit if he overeats. Pt manages this with diet (reported). Pt also drinks an ensure daily. HPI: Nicholas Osborn is a 81 y.o. male with a known history of left pontine CVA, history of MI, impaired hearing, B12 deficiency, history of prostate cancer, hyperlipidemia, comes to the emergency room after he had a syncopal episode today at home. Patient reports walking halfway through the bathroom with his cane and next thing he found was unresponsive was being attended by EMS. He was given sublingual nitroglycerin was brought to the emergency room. He is hemodynamically  stable how work he is feeling quite weak and had some shortness of breath. Type of Study: Bedside swallow evaluation Diet Prior to this Study: Regular;Thin liquids Temperature Spikes Noted: No Respiratory Status: Supplemental O2 delivered via (comment) ([place] - 3 liters) History of Recent Intubation:  No Behavior/Cognition: Alert;Cooperative;Pleasant mood;Distractible Oral Cavity - Dentition: Adequate natural dentition/normal for age Self-Feeding Abilities: Able to feed self;Needs set up Patient Positioning: Upright in bed Baseline Vocal Quality: Normal Volitional Cough: Strong Volitional Swallow: Able to elicit    Oral/Motor/Sensory Function Overall Oral Motor/Sensory Function: Appears within functional limits for tasks assessed (as observed in bolus management)   Ice Chips Ice chips: Not tested   Thin Liquid Thin Liquid: Within functional limits Presentation: Straw Other Comments: Pt consumed 1-2 ounces of thin liquids with no overt or imemdiate s/s of aspiration.    Nectar Thick Nectar Thick Liquid: Not tested   Honey Thick Honey Thick Liquid: Not tested   Puree Puree: Not tested   Solid   GO    Solid: Within functional limits Presentation: Self Fed Other Comments: Pt took large bites of sandwich and had increased mastication time (suspect appropriate for size of bite). No overt or immediate s/s of aspiration.       Nicholas Osborn 09/15/2015,1:11 PM

## 2015-09-15 NOTE — Progress Notes (Signed)
Patient is stable,sleeping pill offered earlier in the shift but declined.Patient later demand to have the sleeping pill after midnight and given.

## 2015-09-15 NOTE — Progress Notes (Signed)
PT Cancellation Note  Patient Details Name: Nicholas Osborn MRN: [id number] DOB: 1920-02-03   Cancelled Treatment:    Reason Eval/Treat Not Completed: Other (comment) (See PT note for further details) PT consult attempted, however pt stating that he does not feel well and does not believe therapy will work for him. He also is a bit disgruntled about having a BM after telling his nurses to wait to change him "because he wasn't finished with the BM". Will attempt at later time/date when more appropriate.    Janyth Contes 09/15/2015, 11:35 AM  Janyth Contes, SPT. [phone]

## 2015-09-15 NOTE — Progress Notes (Addendum)
[facility] [place] NAME: Nicholas Osborn    MR#:  [id number]  DATE OF BIRTH:  1920-05-04  SUBJECTIVE:  CHIEF COMPLAINT:   Chief Complaint  Patient presents with  . Chest Pain  . Fall  . Weakness   generalized weakness. He denies syncope episode. [place] multiple time due to weak knee at home.  REVIEW OF SYSTEMS:  CONSTITUTIONAL: No fever, has generalized weakness.  EYES: No blurred or double vision.  EARS, NOSE, AND THROAT: No tinnitus or ear pain.  RESPIRATORY: No cough, shortness of breath, wheezing or hemoptysis.  CARDIOVASCULAR: No chest pain, orthopnea, edema.  GASTROINTESTINAL: No nausea, vomiting, diarrhea or abdominal pain.  GENITOURINARY: No dysuria, hematuria.  ENDOCRINE: No polyuria, nocturia,  HEMATOLOGY: No anemia, easy bruising or bleeding SKIN: No rash or lesion. MUSCULOSKELETAL: No joint pain or arthritis.   NEUROLOGIC: No tingling, numbness, weakness.  PSYCHIATRY: No anxiety or depression.   DRUG ALLERGIES:  No Known Allergies  VITALS:  Blood pressure 113/43, pulse 71, temperature 97.8 F (36.6 C), temperature source Oral, resp. rate 18, height 5\' 7"  (1.702 m), weight 58.65 kg (129 lb 4.8 oz), SpO2 100 %.  PHYSICAL EXAMINATION:  GENERAL:  81 y.o.-year-old patient lying in the bed with no acute distress.  EYES: Pupils equal, round, reactive to light and accommodation. No scleral icterus. Extraocular muscles intact.  HEENT: Head atraumatic, normocephalic. Oropharynx and nasopharynx clear. Dry oral mucosa. NECK:  Supple, no jugular venous distention. No thyroid enlargement, no tenderness.  LUNGS: Normal breath sounds bilaterally, no wheezing, rales,rhonchi or crepitation. No use of accessory muscles of respiration.  CARDIOVASCULAR: S1, S2 normal. No murmurs, rubs, or gallops.  ABDOMEN: Soft, nontender, nondistended. Bowel sounds present. No organomegaly or mass.  EXTREMITIES: No pedal edema, cyanosis, or  clubbing.  NEUROLOGIC: Cranial nerves II through XII are intact. Muscle strength 5/5 in all extremities. Sensation intact. Gait not checked.  PSYCHIATRIC: The patient is alert and oriented x 3.  SKIN: No obvious rash, lesion, or ulcer.    LABORATORY PANEL:   CBC  Recent Labs Lab 09/14/15 1647  WBC 3.4*  HGB 10.8*  HCT 31.8*  PLT 117*   ------------------------------------------------------------------------------------------------------------------  Chemistries   Recent Labs Lab 09/14/15 1147 09/14/15 1647  NA 132*  --   K 4.1  --   CL 101  --   CO2 26  --   GLUCOSE 169*  --   BUN 12  --   CREATININE 0.79 0.74  CALCIUM 8.5*  --   AST 35  --   ALT 14*  --   ALKPHOS 37*  --   BILITOT 0.8  --    ------------------------------------------------------------------------------------------------------------------  Cardiac Enzymes  Recent Labs Lab 09/15/15 0407  TROPONINI <0.03   ------------------------------------------------------------------------------------------------------------------  RADIOLOGY:  Ct Head Wo Contrast  09/14/2015  CLINICAL DATA:  Fall, right head injury EXAM: CT HEAD WITHOUT CONTRAST TECHNIQUE: Contiguous axial images were obtained from the base of the skull through the vertex without intravenous contrast. COMPARISON:  06/06/2012 FINDINGS: No evidence of parenchymal hemorrhage or extra-axial fluid collection. No mass lesion, mass effect, or midline shift. No CT evidence of acute infarction. Subcortical white matter and periventricular small vessel ischemic changes. Intracranial atherosclerosis. Global cortical atrophy. No ventriculomegaly. The visualized paranasal sinuses are essentially clear. The mastoid air cells are unopacified. No evidence of calvarial fracture. IMPRESSION: No evidence of acute intracranial abnormality. Atrophy with small vessel ischemic changes. Electronically Signed   By: Henderson Newcomer.D.  On: 09/14/2015 13:05   Ct  Angio Chest Pe W/cm &/or Wo Cm  09/14/2015  CLINICAL DATA:  Shortness of breath, syncope, chest pain. EXAM: CT ANGIOGRAPHY CHEST WITH CONTRAST TECHNIQUE: Multidetector CT imaging of the chest was performed using the standard protocol during bolus administration of intravenous contrast. Multiplanar CT image reconstructions and MIPs were obtained to evaluate the vascular anatomy. CONTRAST:  52mL OMNIPAQUE IOHEXOL 350 MG/ML SOLN COMPARISON:  Chest x-ray earlier today. FINDINGS: No filling defects in the pulmonary arteries to suggest pulmonary emboli. Heart is normal size. Aorta is normal caliber. Densely calcified coronary artery. Calcifications throughout the aorta. No mediastinal, hilar, or axillary adenopathy. Diffuse interstitial prominence throughout the left lung, with similar peripheral interstitial prominence throughout the right lung. Volume loss on the left with shift of mediastinal structures to the left. No pleural effusion. Chest wall soft tissues are unremarkable. Imaging into the upper abdomen shows no acute findings. Layering gallstones within the gallbladder. Review of the MIP images confirms the above findings. IMPRESSION: No evidence of pulmonary embolus. Coronary artery disease. Diffuse interstitial thickening throughout the left lung and peripherally in the right lung. Volume loss on the left. I favor this represents chronic interstitial lung disease and fibrosis. Given the asymmetry, a superimposed acute process on the low left cannot be excluded such as infection or less likely asymmetric edema. Cholelithiasis. Electronically Signed   By: Rolm Baptise M.D.   On: 09/14/2015 15:04   Dg Chest Port 1 View  09/14/2015  CLINICAL DATA:  Status post fall today striking the head; onset of mid sternal chest pain today ; the patient was found unresponsive by the fire department. EXAM: PORTABLE CHEST 1 VIEW COMPARISON:  Portable chest x-ray of June 06, 2012 FINDINGS: There is abnormal increased  interstitial density throughout the left lung. There is mild shift of the mediastinum toward the left. The left heart border is partially obscured. The right lung is mildly hyperinflated but clear. No pleural effusion or pneumothorax is observed. The observed portions of the bony thorax are normal. IMPRESSION: Increased interstitial density throughout the left lung may reflect the sequelae of aspiration. Non aspiration pneumonia, pulmonary contusion, and asymmetric pulmonary edema are felt less likely. The observed bony thorax exhibits no acute fracture. Electronically Signed   By: David  Martinique M.D.   On: 09/14/2015 12:33    EKG:   Orders placed or performed during the hospital encounter of 09/14/15  . ED EKG  . ED EKG    ASSESSMENT AND PLAN:   1. Syncopal episode etiology unclear IV fluids for hydration and PT.  2. Interstitial lung disease or fibrosis.  per CT angio of chest, no PE. Abnormal chest x-ray/CT chest Discontinue zithromax. Outpatient pulmonary follow-up  3. History of CVA patient uses a cane to walk. Continue aspirin 81 mg daily  4. History of hypertension blood pressure is stable not on any antihypertensives at home we'll continue to monitor  5. BPH continue Flomax  Hyponatremia. Continue normal saline IV and follow-up BMP.   Per PT evaluation, the patient is a skilled nursing facility placement. All the records are reviewed and case discussed with Care Management/Social Workerr. Management plans discussed with the patient, family and they are in agreement. Greater than 50% time was spent on coordination of care and face-to-face counseling.  CODE STATUS: DO NOT RESUSCITATE  TOTAL TIME TAKING CARE OF THIS PATIENT: 38 minutes.   POSSIBLE D/C IN 2-3 DAYS, DEPENDING ON CLINICAL CONDITION.   Demetrios Loll M.D on 09/15/2015  at 5:13 PM  Between 7am to 6pm - Pager - [phone]  After 6pm go to [email or web address] - password [email or web address] [facility]  [facility]  Office   [phone]  CC: Primary care physician; No primary care provider on file.

## 2015-09-15 NOTE — Care Management Note (Signed)
Case Management Note  Patient Details  Name: LUKIS BUNT MRN: [id number] Date of Birth: 08-05-1920  Subjective/Objective:     Patient presents from home with syncope.  Patient states that he lives home alone, however his brother lives 77 miles away, and his son lives 17 miles away.  Patient states that he has a power wheelchair, elevator, and life alert in the home.  Patient states that family members provide transportation when needed.  Patient states that he has had home health services in the past, but is not currently open to anyone.  Patient states that has also paid out of pocket for personal care providers however "it's not that I can't pay them, I don't want to.  It's a rip off".       Patient is currently on acute O2    Action/Plan: PT consult in place. If patient requires home O2 at time of discharge will require qualifying O2 sats and diagnosis.  RNCM to follow for discharge planning.    Expected Discharge Date:                  Expected Discharge Plan:     In-House Referral:     Discharge planning Services     Post Acute Care Choice:    Choice offered to:     DME Arranged:    DME Agency:     HH Arranged:    [place] Agency:     Status of Service:     Medicare Important Message Given:    Date Medicare IM Given:    Medicare IM give by:    Date Additional Medicare IM Given:    Additional Medicare Important Message give by:     If discussed at [place] of Stay Meetings, dates discussed:    Additional Comments:  Beverly Sessions, RN 09/15/2015, 1:39 PM

## 2015-09-15 NOTE — Clinical Social Work Note (Signed)
Clinical Social Work Assessment  Patient Details  Name: QUAYSHAUN HUBBERT MRN: [id number] Date of Birth: [birth year]  Date of referral:  09/15/15               Reason for consult:  Discharge Planning                Permission sought to share information with:    Permission granted to share information::     Name::        Agency::     Relationship::     Contact Information:     Housing/Transportation Living arrangements for the past 2 months:  Single Family Home Source of Information:  Patient Patient Interpreter Needed:  None Criminal Activity/Legal Involvement Pertinent to Current Situation/Hospitalization:  No - Comment as needed Significant Relationships:  Siblings, Adult Children Lives with:  Self Do you feel safe going back to the place where you live?  Yes Need for family participation in patient care:  No (Coment)  Care giving concerns:  Patient resides alone.   Social Worker assessment / plan:  CSW consulted for discharge planning. RN CM had met with patient earlier today as well. CSW met with patient this afternoon and he was about to get a bath with the nurse's aid's supervision. Patient stated he was wanting to return home at discharge. He explained that he could have his brother come get him tomorrow but he stated that his brother is 15 and is the only other surviving sibling he has out of 7 other siblings. Patient stated that he wishes to return home via EMS tomorrow and states that his brother can be there at the house when he arrives. He explained that his brother lives about 45 miles away. He did not mention his son with me but did state he has a son earlier with RN CM. Patient stated that he has a power wheelchair, elevator, walker, cane, and crutches at home. He states he has life alert as well. Patient was complaining to CSW regarding the oxygen he was wearing and stated that he has never needed oxygen prior and states he will not need it at discharge. He states to CSW  that it dries his mouth out and makes his lips chapped and he will not use it. PT is pending currently and will await their recommendations. CSW will continue to follow.   Employment status:  Retired Nurse, adult PT Recommendations:  Not assessed at this time Information / Referral to community resources:     Patient/Family's Response to care:  Patient informs CSW that he is wanting to return home as soon as he can.  Patient/Family's Understanding of and Emotional Response to Diagnosis, Current Treatment, and Prognosis:  Patient verbalizes understanding of his condition and explains that he has all his necessities at home.   Emotional Assessment Appearance:  Appears stated age Attitude/Demeanor/Rapport:   (pleasant and cooperative) Affect (typically observed):  Accepting, Calm Orientation:  Oriented to Self, Oriented to Place, Oriented to  Time, Oriented to Situation Alcohol / Substance use:  Not Applicable Psych involvement (Current and /or in the community):  No (Comment)  Discharge Needs  Concerns to be addressed:  No discharge needs identified Readmission within the last 30 days:  No Current discharge risk:  None Barriers to Discharge:  No Barriers Identified   Shela Leff, LCSW 09/15/2015, 4:41 PM

## 2015-09-15 NOTE — Evaluation (Signed)
Physical Therapy Evaluation Patient Details Name: Nicholas Osborn MRN: [id number] DOB: 1920-03-09 Today's Date: 09/15/2015   History of Present Illness  Pt is a 81 yo male who was admitted to the hospital following syncopal episode in his bathroom. Once at hospital he was found to have infiltrate in L lung and possible pneumonia.   Clinical Impression  Pt presents with hx of MI, cancer, and pneumonia. Examination reveals that pt performs bed mobility at min A, transfers at min A, and is unable to ambulate secondary to weakness and buckling LEs once in standing. Pt willing to perform therapy tasks, however he requires encouragement. His primary physical deficits include generalized weakness and decreased activity tolerance. Although his baseline includes very limited ambulation at home, he is not able to perform any ambulation at this time. Because of these strength and endurance deficits he will continue to benefit from skilled PT in order to return to safe navigation of home environment and optimal PLOF. Pt talkative and pleasant to work with.     Follow Up Recommendations SNF;Supervision/Assistance - 24 hour (If SNF refusal, pt requires 24 hr assist use of his WC)    Equipment Recommendations  None recommended by PT    Recommendations for Other Services       Precautions / Restrictions Precautions Precautions: Fall Restrictions Weight Bearing Restrictions: No      Mobility  Bed Mobility Overal bed mobility: Needs Assistance Bed Mobility: Supine to Sit     Supine to sit: Min assist     General bed mobility comments: Pt needs assist with hand to help pull his trunk up into sitting. Pt stable sitting EOB  Transfers Overall transfer level: Needs assistance Equipment used: Rolling walker (2 wheeled) Transfers: Sit to/from Stand Sit to Stand: Min assist         General transfer comment: Pt needs minor assist to get into standing however he can't reach full standing (roughly  90%). Shortly after reaching his peak of standing his legs start to wobble and buckle, which is somewhat correctable by pt but requires min A for full collapse   Ambulation/Gait Ambulation/Gait assistance:  (Not appropriate secondary to weakness )              Stairs            Wheelchair Mobility    Modified Rankin (Stroke Patients Only)       Balance Overall balance assessment: History of Falls;Needs assistance                                           Pertinent Vitals/Pain Pain Assessment: No/denies pain    Home Living Family/patient expects to be discharged to:: Private residence Living Arrangements: Other relatives (his brother ) Available Help at Discharge: Available PRN/intermittently (Brother in a very limited role ) Type of Home: House Home Access: Elevator     Home Layout:  (Pt has elevator that goes from ground level to floor level) Home Equipment: Wheelchair - power;Cane - single point Additional Comments: Pt largely uses SPC and walls at home, but also heavily uses his motorized WC to get around house and out to car.     Prior Function Level of Independence: Independent with assistive device(s)         Comments: Pt was indep with assistive device but as he said it's "not pretty" and he basically "drags  his legs with him" His brother buys groceries but cannot provide physical assist      Hand Dominance        Extremity/Trunk Assessment   Upper Extremity Assessment: Generalized weakness           Lower Extremity Assessment: Generalized weakness         Communication   Communication: No difficulties  Cognition Arousal/Alertness: Awake/alert Behavior During Therapy: WFL for tasks assessed/performed Overall Cognitive Status: Within Functional Limits for tasks assessed                      General Comments      Exercises Other Exercises Other Exercises: Pt performed bilateral therex x 10 reps at min A  for facilitation of movement. Exercises included: ankle pumps, SAQ, glute sets, LAQ, SLR, and hip abd. Pt also performed resisted pull and push with therapist's hands      Assessment/Plan    PT Assessment Patient needs continued PT services  PT Diagnosis Difficulty walking;Generalized weakness   PT Problem List Decreased strength;Decreased activity tolerance;Decreased mobility;Decreased safety awareness  PT Treatment Interventions DME instruction;Gait training;Stair training;Functional mobility training;Therapeutic activities;Therapeutic exercise;Balance training;Neuromuscular re-education   PT Goals (Current goals can be found in the Care Plan section) Acute Rehab PT Goals Patient Stated Goal: to go home PT Goal Formulation: With patient Time For Goal Achievement: 09/29/15 Potential to Achieve Goals: Fair    Frequency Min 2X/week   Barriers to discharge        Co-evaluation               End of Session Equipment Utilized During Treatment: Gait belt;Oxygen Activity Tolerance: Patient tolerated treatment well (limited by weakness) Patient left: in bed;with call bell/phone within reach;with bed alarm set Nurse Communication: Mobility status         Time: 1540-1616 PT Time Calculation (min) (ACUTE ONLY): 36 min   Charges:         PT G CodesJanyth Contes 09-23-15, 5:14 PM  Janyth Contes, SPT. [phone]

## 2015-09-16 LAB — BASIC METABOLIC PANEL
ANION GAP: 4 — AB (ref 5–15)
BUN: 12 mg/dL (ref 6–20)
CALCIUM: 8.2 mg/dL — AB (ref 8.9–10.3)
CHLORIDE: 105 mmol/L (ref 101–111)
CO2: 27 mmol/L (ref 22–32)
Creatinine, Ser: 0.65 mg/dL (ref 0.61–1.24)
GFR calc non Af Amer: >60 mL/min (ref >60)
GLUCOSE: 104 mg/dL — AB (ref 65–99)
Potassium: 4.1 mmol/L (ref 3.5–5.1)
Sodium: 136 mmol/L (ref 135–145)

## 2015-09-16 MED ORDER — DEXTROSE 5 % IV SOLN
1.0000 g | INTRAVENOUS | Status: DC
  Administered 2015-09-16: 1 g via INTRAVENOUS
  Filled 2015-09-16: qty 10

## 2015-09-16 NOTE — Progress Notes (Signed)
   09/16/15 1045  Clinical Encounter Type  Visited With Patient and family together  Visit Type Initial  Referral From Chaplain  Consult/Referral To Chaplain  Spiritual Encounters  Spiritual Needs Prayer;Emotional  Stress Factors  Patient Stress Factors Exhausted;Health changes  Met w/patient who complained of constant lower back pain due to existing injury. Provided opportunity for life review, pastoral care, and prayer. Chap. Zinnia Tindall G. Eads

## 2015-09-16 NOTE — Discharge Instructions (Signed)
Heart healthy diet. °Activity as tolerated. °HHPT. °

## 2015-09-16 NOTE — Progress Notes (Signed)
Pt is a&o, SB with a 1st degree HB on tele. Pt had a 2.07sec pause and MD made aware. Orders to D/c pt to home with [facility] but pt refused. Discharge instructions given to pt, IV and tele removed and pt called EMS for self. Nurse explained to EMS that EMS transport was not approved by hospital but pt is willing to self-pay. EMS said they will send EMS but SW unsure if they will truly take pt upon arrival. Nursing will continue to assess.

## 2015-09-16 NOTE — Care Management (Signed)
Patient to discharge today.  PT has recommend SNF.  Patient has refused SNF placement.  I spoke with patient and attempted to arrange home health nursing, and PT.  Patient refused.  Patient is alert and oriented.  Patient's brother is at bedside and is agreement with patient's decision.  Patient states that his brother stays with him every night, and is with him during the day except on Tuesdays and Saturdays.  Patient has been weaned to RA.  Dr. Bridgett Larsson notified of patient's decision to refuse SNF and home health.  After leaving the room patient called EMS himself to arrange transportation home.  I informed patient that EMS transport would likely not be paid by insurance.  He states "I plan on paying out of pocket".  Patient states that he brother will be at the home when he arrives.  CSW updated and has assisted with EMS transport.   RNCM signing off

## 2015-09-16 NOTE — Clinical Social Work Note (Signed)
CSW informed by RN CM that patient's brother left the hospital a few minutes ago and that the patient had stated he had called EMS to transport him. CSW contacted patient's nurse who confirmed this and stated that once patient had hung up with EMS that EMS called her to see if patient was confused. CSW contacted EMS dispatch and was informed patient did not provide them with an address and thus they did not have him on the list for transport. While on the phone with EMS, RN CM went in to speak with patient to inform him that his insurance will more than likely not cover EMS and asked if his brother could transport. Patient informed RN CM he was going to go by EMS and that if he had to pay out of pocket he would. CSW made arrangements and called EMS to arrange patient's transport. RN CM confirmed that patient's brother was at the home address to receive him. Shela Leff MSW,LCSW [phone]

## 2015-09-16 NOTE — Discharge Summary (Signed)
[facility] [place] NAME: Nicholas Osborn    MR#:  [id number]  DATE OF BIRTH:  Apr 06, 1920  DATE OF ADMISSION:  09/14/2015 ADMITTING PHYSICIAN: Fritzi Mandes, MD  DATE OF DISCHARGE: 09/16/2015  3:44 PM  PRIMARY CARE PHYSICIAN: No primary care provider on file.    ADMISSION DIAGNOSIS:  Syncope and collapse [R55] Chest pain, unspecified chest pain type [R07.9]   DISCHARGE DIAGNOSIS:    SECONDARY DIAGNOSIS:   Past Medical History  Diagnosis Date  . MI (myocardial infarction) ([place])   . Cancer [facility])     HOSPITAL COURSE:   1. Syncopal episode etiology unclear. The patient denied syncope episode but complain of knee weakness.  He was treated with IV fluids for hydration and PT.  2. Interstitial lung disease or fibrosis. per CT angio of chest, no PE. Abnormal chest x-ray/CT chest  Discontinued zithromax. Outpatient pulmonary follow-up when necessary.  3. History of CVA patient uses a cane to walk.  Continue aspirin 81 mg daily   4. History of hypertension, blood pressure is stable not on any antihypertensives at home  5. BPH continue Flomax   Hyponatremia. Improved after treatment of normal saline IV.  PT evaluation suggest the patient needed skilled nursing facility placement. However, the patient refused facility placement and refused home health with PT. DISCHARGE CONDITIONS:   Stable, discharged to home today.  CONSULTS OBTAINED:     DRUG ALLERGIES:  No Known Allergies  DISCHARGE MEDICATIONS:   Discharge Medication List as of 09/16/2015 12:01 PM    CONTINUE these medications which have NOT CHANGED   Details  clotrimazole-betamethasone (LOTRISONE) cream apply topically once daily if needed, Historical Med    folic acid (FOLVITE) 1 MG tablet Take 1 mg by mouth daily., Starting 09/06/2015, Until Discontinued, Historical Med    nitroGLYCERIN (NITRODUR - DOSED IN MG/24 HR) 0.1 mg/hr patch Place 0.1 mg onto the skin  daily. Leave on for 12-14 hours and then remove for 10-12, Starting 08/24/2015, Until Discontinued, Historical Med    oxyCODONE-acetaminophen (PERCOCET) 7.5-325 MG tablet Take 1 tablet by mouth 4 (four) times daily., Starting 09/09/2015, Until Discontinued, Historical Med    tamsulosin (FLOMAX) 0.4 MG CAPS capsule Take 1 capsule by mouth daily., Starting 09/06/2015, Until Discontinued, Historical Med    temazepam (RESTORIL) 30 MG capsule Take 1 capsule by mouth at bedtime as needed., Starting 08/20/2015, Until Discontinued, Historical Med         DISCHARGE INSTRUCTIONS:    If you experience worsening of your admission symptoms, develop shortness of breath, life threatening emergency, suicidal or homicidal thoughts you must seek medical attention immediately by calling 911 or calling your MD immediately  if symptoms less severe.  You Must read complete instructions/literature along with all the possible adverse reactions/side effects for all the Medicines you take and that have been prescribed to you. Take any new Medicines after you have completely understood and accept all the possible adverse reactions/side effects.   Please note  You were cared for by a hospitalist during your hospital stay. If you have any questions about your discharge medications or the care you received while you were in the hospital after you are discharged, you can call the unit and asked to speak with the hospitalist on call if the hospitalist that took care of you is not available. Once you are discharged, your primary care physician will handle any further medical issues. Please note that NO REFILLS for any discharge medications  will be authorized once you are discharged, as it is imperative that you return to your primary care physician (or establish a relationship with a primary care physician if you do not have one) for your aftercare needs so that they can reassess your need for medications and monitor your lab  values.    Today   SUBJECTIVE   No complaint.   VITAL SIGNS:  Blood pressure 136/49, pulse 64, temperature 97.7 F (36.5 C), temperature source Oral, resp. rate 18, height 5\' 7"  (1.702 m), weight 58.65 kg (129 lb 4.8 oz), SpO2 98 %.  I/O:   Intake/Output Summary (Last 24 hours) at 09/16/15 1710 Last data filed at 09/16/15 1319  Gross per 24 hour  Intake    480 ml  Output    300 ml  Net    180 ml    PHYSICAL EXAMINATION:  GENERAL:  81 y.o.-year-old patient lying in the bed with no acute distress.  EYES: Pupils equal, round, reactive to light and accommodation. No scleral icterus. Extraocular muscles intact.  HEENT: Head atraumatic, normocephalic. Oropharynx and nasopharynx clear.  NECK:  Supple, no jugular venous distention. No thyroid enlargement, no tenderness.  LUNGS: Normal breath sounds bilaterally, no wheezing, rales,rhonchi or crepitation. No use of accessory muscles of respiration.  CARDIOVASCULAR: S1, S2 normal. No murmurs, rubs, or gallops.  ABDOMEN: Soft, non-tender, non-distended. Bowel sounds present. No organomegaly or mass.  EXTREMITIES: No pedal edema, cyanosis, or clubbing.  NEUROLOGIC: Cranial nerves II through XII are intact. Muscle strength 3-4/5 in all extremities. Sensation intact. Gait not checked.  PSYCHIATRIC: The patient is alert and oriented x 3.  SKIN: No obvious rash, lesion, or ulcer.   DATA REVIEW:   CBC  Recent Labs Lab 09/14/15 1647  WBC 3.4*  HGB 10.8*  HCT 31.8*  PLT 117*    Chemistries   Recent Labs Lab 09/14/15 1147  09/16/15 0403  NA 132*  --  136  K 4.1  --  4.1  CL 101  --  105  CO2 26  --  27  GLUCOSE 169*  --  104*  BUN 12  --  12  CREATININE 0.79  < > 0.65  CALCIUM 8.5*  --  8.2*  AST 35  --   --   ALT 14*  --   --   ALKPHOS 37*  --   --   BILITOT 0.8  --   --   < > = values in this interval not displayed.  Cardiac Enzymes  Recent Labs Lab 09/15/15 0407  TROPONINI <0.03    Microbiology Results   No results found for this or any previous visit.  RADIOLOGY:  No results found.      Management plans discussed with the patient, his brother and they are in agreement.  CODE STATUS:     Code Status Orders        Start     Ordered   09/14/15 1612  Do not attempt resuscitation (DNR)   Continuous    Question Answer Comment  In the event of cardiac or respiratory ARREST Do not call a "code blue"   In the event of cardiac or respiratory ARREST Do not perform Intubation, CPR, defibrillation or ACLS   In the event of cardiac or respiratory ARREST Use medication by any route, position, wound care, and other measures to relive pain and suffering. May use oxygen, suction and manual treatment of airway obstruction as needed for comfort.  09/14/15 1611    Advance Directive Documentation        Most Recent Value   Type of Advance Directive  Healthcare Power of Attorney, Living will   Pre-existing out of facility DNR order (yellow form or pink MOST form)     "MOST" Form in Place?        TOTAL TIME TAKING CARE OF THIS PATIENT: 37 minutes.    Demetrios Loll M.D on 09/16/2015 at 5:10 PM  Between 7am to 6pm - Pager - [phone]  After 6pm go to [email or web address] - password [email or web address] [facility]  [place] [facility]  Office  [phone]  CC: Primary care physician; No primary care provider on file.

## 2016-09-22 ENCOUNTER — Emergency Department: Payer: Medicare Other

## 2016-09-22 ENCOUNTER — Encounter: Payer: Self-pay | Admitting: Emergency Medicine

## 2016-09-22 ENCOUNTER — Inpatient Hospital Stay
Admission: EM | Admit: 2016-09-22 | Discharge: 2016-09-24 | DRG: 310 | Disposition: A | Discharge status: Home / Self Care | Payer: Medicare Other | Attending: Family Medicine | Admitting: Family Medicine

## 2016-09-22 DIAGNOSIS — Z8673 Personal history of transient ischemic attack (TIA), and cerebral infarction without residual deficits: Secondary | ICD-10-CM

## 2016-09-22 DIAGNOSIS — R001 Bradycardia, unspecified: Secondary | ICD-10-CM | POA: Diagnosis present

## 2016-09-22 DIAGNOSIS — Z79899 Other long term (current) drug therapy: Secondary | ICD-10-CM

## 2016-09-22 DIAGNOSIS — Z853 Personal history of malignant neoplasm of breast: Secondary | ICD-10-CM | POA: Diagnosis not present

## 2016-09-22 DIAGNOSIS — I252 Old myocardial infarction: Secondary | ICD-10-CM | POA: Diagnosis not present

## 2016-09-22 DIAGNOSIS — E782 Mixed hyperlipidemia: Secondary | ICD-10-CM | POA: Diagnosis present

## 2016-09-22 DIAGNOSIS — Z8546 Personal history of malignant neoplasm of prostate: Secondary | ICD-10-CM

## 2016-09-22 DIAGNOSIS — L8990 Pressure ulcer of unspecified site, unspecified stage: Secondary | ICD-10-CM | POA: Diagnosis present

## 2016-09-22 DIAGNOSIS — H9190 Unspecified hearing loss, unspecified ear: Secondary | ICD-10-CM | POA: Diagnosis present

## 2016-09-22 DIAGNOSIS — I959 Hypotension, unspecified: Secondary | ICD-10-CM | POA: Diagnosis present

## 2016-09-22 DIAGNOSIS — E538 Deficiency of other specified B group vitamins: Secondary | ICD-10-CM | POA: Diagnosis present

## 2016-09-22 DIAGNOSIS — F0390 Unspecified dementia without behavioral disturbance: Secondary | ICD-10-CM | POA: Diagnosis present

## 2016-09-22 DIAGNOSIS — Z66 Do not resuscitate: Secondary | ICD-10-CM | POA: Diagnosis present

## 2016-09-22 DIAGNOSIS — I442 Atrioventricular block, complete: Principal | ICD-10-CM | POA: Diagnosis present

## 2016-09-22 LAB — COMPREHENSIVE METABOLIC PANEL
ALBUMIN: 2.9 g/dL — AB (ref 3.5–5.0)
ALK PHOS: 63 U/L (ref 38–126)
ALT: 12 U/L — AB (ref 17–63)
ANION GAP: 8 (ref 5–15)
AST: 24 U/L (ref 15–41)
BILIRUBIN TOTAL: 0.4 mg/dL (ref 0.3–1.2)
BUN: 22 mg/dL — AB (ref 6–20)
CALCIUM: 8.5 mg/dL — AB (ref 8.9–10.3)
CO2: 22 mmol/L (ref 22–32)
CREATININE: 1.1 mg/dL (ref 0.61–1.24)
Chloride: 103 mmol/L (ref 101–111)
GFR calc Af Amer: >60 mL/min (ref >60)
GFR calc non Af Amer: 55 mL/min — ABNORMAL LOW (ref >60)
GLUCOSE: 152 mg/dL — AB (ref 65–99)
Potassium: 4.7 mmol/L (ref 3.5–5.1)
Sodium: 133 mmol/L — ABNORMAL LOW (ref 135–145)
TOTAL PROTEIN: 6.2 g/dL — AB (ref 6.5–8.1)

## 2016-09-22 LAB — CBC WITH DIFFERENTIAL/PLATELET
BASOS ABS: 0 10*3/uL (ref 0–0.1)
Basophils Relative: 0 %
EOS ABS: 0.2 10*3/uL (ref 0–0.7)
EOS PCT: 2 %
HCT: 34.8 % — ABNORMAL LOW (ref 40.0–52.0)
Hemoglobin: 11.5 g/dL — ABNORMAL LOW (ref 13.0–18.0)
LYMPHS ABS: 3.1 10*3/uL (ref 1.0–3.6)
Lymphocytes Relative: 36 %
MCH: 33.7 pg (ref 26.0–34.0)
MCHC: 33 g/dL (ref 32.0–36.0)
MCV: 102.3 fL — ABNORMAL HIGH (ref 80.0–100.0)
Monocytes Absolute: 0.3 10*3/uL (ref 0.2–1.0)
Monocytes Relative: 4 %
Neutro Abs: 5 10*3/uL (ref 1.4–6.5)
Neutrophils Relative %: 58 %
PLATELETS: 138 10*3/uL — AB (ref 150–440)
RBC: 3.41 MIL/uL — AB (ref 4.40–5.90)
RDW: 14.7 % — ABNORMAL HIGH (ref 11.5–14.5)
WBC: 8.6 10*3/uL (ref 3.8–10.6)

## 2016-09-22 LAB — PROTIME-INR
INR: 1.08
PROTHROMBIN TIME: 14 s (ref 11.4–15.2)

## 2016-09-22 LAB — MRSA PCR SCREENING: MRSA BY PCR: NEGATIVE

## 2016-09-22 LAB — TYPE AND SCREEN
ABO/RH(D): A POS
Antibody Screen: NEGATIVE

## 2016-09-22 LAB — MAGNESIUM: Magnesium: 1.8 mg/dL (ref 1.7–2.4)

## 2016-09-22 LAB — TROPONIN I: Troponin I: 0.04 ng/mL (ref <0.03)

## 2016-09-22 LAB — GLUCOSE, CAPILLARY: Glucose-Capillary: 148 mg/dL — ABNORMAL HIGH (ref 65–99)

## 2016-09-22 MED ORDER — EPINEPHRINE PF 1 MG/10ML IJ SOSY
1.0000 | PREFILLED_SYRINGE | Freq: Once | INTRAMUSCULAR | Status: AC
  Administered 2016-09-22: 1 mg via INTRAVENOUS

## 2016-09-22 MED ORDER — MORPHINE SULFATE (PF) 4 MG/ML IV SOLN
2.0000 mg | INTRAVENOUS | Status: DC | PRN
Start: 2016-09-22 — End: 2016-09-24
  Administered 2016-09-22 – 2016-09-23 (×3): 2 mg via INTRAVENOUS
  Filled 2016-09-22 (×3): qty 1

## 2016-09-22 MED ORDER — ATROPINE SULFATE 1 MG/10ML IJ SOSY
PREFILLED_SYRINGE | INTRAMUSCULAR | Status: AC
  Filled 2016-09-22: qty 10

## 2016-09-22 MED ORDER — ACETAMINOPHEN 325 MG PO TABS: 650.0000 mg | ORAL_TABLET | Freq: Four times a day (QID) | ORAL | Status: DC | PRN

## 2016-09-22 MED ORDER — ENOXAPARIN SODIUM 30 MG/0.3ML ~~LOC~~ SOLN
30.0000 mg | SUBCUTANEOUS | Status: DC
  Administered 2016-09-22 – 2016-09-23 (×2): 30 mg via SUBCUTANEOUS
  Filled 2016-09-22 (×2): qty 0.3

## 2016-09-22 MED ORDER — TEMAZEPAM 7.5 MG PO CAPS: 30.0000 mg | ORAL_CAPSULE | Freq: Every evening | ORAL | Status: DC | PRN

## 2016-09-22 MED ORDER — TAMSULOSIN HCL 0.4 MG PO CAPS
0.4000 mg | ORAL_CAPSULE | Freq: Every day | ORAL | Status: DC
  Administered 2016-09-22 – 2016-09-23 (×2): 0.4 mg via ORAL
  Filled 2016-09-22 (×2): qty 1

## 2016-09-22 MED ORDER — EPINEPHRINE PF 1 MG/ML IJ SOLN
1.0000 mg | Freq: Once | INTRAMUSCULAR | Status: DC
Start: 2016-09-22 — End: 2016-09-22

## 2016-09-22 MED ORDER — SODIUM CHLORIDE 0.9% FLUSH
3.0000 mL | Freq: Two times a day (BID) | INTRAVENOUS | Status: DC
  Administered 2016-09-22 – 2016-09-24 (×4): 3 mL via INTRAVENOUS

## 2016-09-22 MED ORDER — ONDANSETRON HCL 4 MG PO TABS: 4.0000 mg | ORAL_TABLET | Freq: Four times a day (QID) | ORAL | Status: DC | PRN

## 2016-09-22 MED ORDER — SODIUM CHLORIDE 0.9 % IV BOLUS (SEPSIS)
1000.0000 mL | Freq: Once | INTRAVENOUS | Status: AC
Start: 2016-09-22 — End: 2016-09-22
  Administered 2016-09-22: 1000 mL via INTRAVENOUS

## 2016-09-22 MED ORDER — ACETAMINOPHEN 650 MG RE SUPP: 650.0000 mg | Freq: Four times a day (QID) | RECTAL | Status: DC | PRN

## 2016-09-22 MED ORDER — ORAL CARE MOUTH RINSE
15.0000 mL | Freq: Two times a day (BID) | OROMUCOSAL | Status: DC
  Administered 2016-09-22 – 2016-09-24 (×4): 15 mL via OROMUCOSAL

## 2016-09-22 MED ORDER — ONDANSETRON HCL 4 MG/2ML IJ SOLN
4.0000 mg | Freq: Four times a day (QID) | INTRAMUSCULAR | Status: DC | PRN
  Administered 2016-09-22: 4 mg via INTRAVENOUS
  Filled 2016-09-22: qty 2

## 2016-09-22 MED ORDER — FOLIC ACID 1 MG PO TABS
1.0000 mg | ORAL_TABLET | Freq: Every day | ORAL | Status: DC
  Administered 2016-09-22 – 2016-09-23 (×2): 1 mg via ORAL
  Filled 2016-09-22 (×2): qty 1

## 2016-09-22 MED ORDER — ONDANSETRON HCL 4 MG/2ML IJ SOLN
INTRAMUSCULAR | Status: AC
  Administered 2016-09-22: 4 mg
  Filled 2016-09-22: qty 2

## 2016-09-22 MED ORDER — OXYCODONE HCL 5 MG PO TABS
5.0000 mg | ORAL_TABLET | ORAL | Status: DC | PRN
  Administered 2016-09-22: 5 mg via ORAL
  Filled 2016-09-22: qty 1

## 2016-09-22 MED ORDER — ATROPINE SULFATE 1 MG/10ML IJ SOSY
1.0000 mg | PREFILLED_SYRINGE | Freq: Once | INTRAMUSCULAR | Status: AC
  Administered 2016-09-22: 1 mg via INTRAVENOUS

## 2016-09-22 MED ORDER — OXYCODONE-ACETAMINOPHEN 7.5-325 MG PO TABS
1.0000 | ORAL_TABLET | Freq: Four times a day (QID) | ORAL | Status: DC
  Administered 2016-09-22 – 2016-09-23 (×5): 1 via ORAL
  Filled 2016-09-22 (×5): qty 1

## 2016-09-22 MED FILL — Medication: Qty: 1 | Status: AC

## 2016-09-22 NOTE — Progress Notes (Addendum)
Called by nursing that patient's heart rate is in 20s, has been drifting down all day with slow drifting fall in BP as well.  He is admitted here for 3rd degree heart block.  Ordered to start external pacing with goals heart rate 50-60s.  Patient refused pacing, stated he is a DNR and understands that if his heart could stop without pacing and refuses it the same.  Jacqulyn [facility] 09/22/2016, 9:32 PM

## 2016-09-22 NOTE — ED Provider Notes (Signed)
Time Seen: Approximately 1633  I have reviewed the triage notes  Chief Complaint: Bradycardia and Hypotension   History of Present Illness: Nicholas Osborn is a 82 y.o. male *who presents after a near syncopal episode from local clinic. He was found to be very bradycardic and very lightheaded and was transported back from the triage area essentially unresponsive with a bradycardia of approximately 30 bpm. Patient states he does have a history of cardiovascular disease in the past. At the bedside and patient was found to be very diaphoretic though after epinephrine was able to answer some questions. He has some short-term memory deficits. He denies any significant pain at this time. He denies any melena or hematochezia. He denies any chest pain Patient was recently evaluated here at the hospital for chest pain and some near-syncope and did well at that time  Past Medical History:  Diagnosis Date  . Cancer ([place])   . MI (myocardial infarction)     Patient Active Problem List   Diagnosis Date Noted  . Complete heart block ([place]) 09/22/2016  . Pneumonia 09/14/2015    History reviewed. No pertinent surgical history.  History reviewed. No pertinent surgical history.  Current Outpatient Rx  . Order #: [id number] Class: Historical Med  . Order #: [id number] Class: Historical Med  . Order #: [id number] Class: Historical Med  . Order #: [id number] Class: Historical Med  . Order #: [id number] Class: Historical Med  . Order #: [id number] Class: Historical Med    Allergies:  Patient has no known allergies.  Family History: History reviewed. No pertinent family history.  Social History: Social History  Substance Use Topics  . Smoking status: Never Smoker  . Smokeless tobacco: Never Used  . Alcohol use No     Review of Systems:   10 point review of systems was performed and was otherwise negative:  Constitutional: No fever Eyes: No visual disturbances ENT: No sore throat, ear  pain Cardiac: No chest pain Respiratory: No shortness of breath, wheezing, or stridor Abdomen: No abdominal pain, no vomiting, No diarrhea Endocrine: No weight loss, No night sweats Extremities: No peripheral edema, cyanosis Skin: No rashes, easy bruising Neurologic: No focal weakness, trouble with speech or swollowing Urologic: No dysuria, Hematuria, or urinary frequency   Physical Exam:  ED Triage Vitals  Enc Vitals Group     BP 09/22/16 1545 (!) 172/68     Pulse Rate 09/22/16 1545 68     Resp 09/22/16 1545 16     Temp 09/22/16 1546 98.5 F (36.9 C)     Temp Source 09/22/16 1546 Rectal     SpO2 09/22/16 1545 91 %     Weight 09/22/16 1545 110 lb (49.9 kg)     Height 09/22/16 1545 5\' 7"  (1.702 m)     Head Circumference --      Peak Flow --      Pain Score 09/22/16 1546 0     Pain Loc --      Pain Edu? --      Excl. in [place]? --     General: Awake , Alert , and Oriented times 3; GCS 15Panel and apparent some mild diaphoresis Head: Normal cephalic , atraumatic Eyes: Pupils equal , round, reactive to light Nose/Throat: No nasal drainage, patent upper airway without erythema or exudate.  Neck: Supple, Full range of motion, No anterior adenopathy or palpable thyroid masses Lungs: Clear to ascultation without wheezes , rhonchi, or rales Heart: Bradycardic without murmurs or rubs Abdomen: Soft, non tender without  rebound, guarding , or rigidity; bowel sounds positive and symmetric in all 4 quadrants. No organomegaly .        Extremities: 2 plus symmetric pulses. No edema, clubbing or cyanosis Neurologic: normal ambulation, Motor symmetric without deficits, sensory intact Skin: warm, dry, no rashes   Labs:   All laboratory work was reviewed including any pertinent negatives or positives listed below:  Labs Reviewed  CBC WITH DIFFERENTIAL/PLATELET - Abnormal; Notable for the following:       Result Value   RBC 3.41 (*)    Hemoglobin 11.5 (*)    HCT 34.8 (*)    MCV 102.3 (*)     RDW 14.7 (*)    Platelets 138 (*)    All other components within normal limits  TROPONIN I - Abnormal; Notable for the following:    Troponin I 0.04 (*)    All other components within normal limits  COMPREHENSIVE METABOLIC PANEL - Abnormal; Notable for the following:    Sodium 133 (*)    Glucose, Bld 152 (*)    BUN 22 (*)    Calcium 8.5 (*)    Total Protein 6.2 (*)    Albumin 2.9 (*)    ALT 12 (*)    GFR calc non Af Amer 55 (*)    All other components within normal limits  PROTIME-INR  MAGNESIUM  TYPE AND SCREEN  Troponin slightly elevated otherwise labs were with him brought limits of normal  EKG: * #1 ED ECG REPORT I, Daymon Larsen, the attending physician, personally viewed and interpreted this ECG.  Date: 09/22/2016. Performed after epinephrine EKG Time:1540 Rate: 83 Rhythm: Atrial fibrillation QRS Axis: normal Intervals: Right bundle branch block pattern ST/T Wave abnormalities: normal Conduction Disturbances: none Narrative Interpretation: unremarkable No obvious acute ischemic changes  EKG #2 ED ECG REPORT I, Daymon Larsen, the attending physician, personally viewed and interpreted this ECG.  Date: 09/22/2016 EKG Time: 1625 Rate: 38 Rhythm: Third-degree heart block QRS Axis: normal Intervals: Incomplete right bundle-branch block ST/T Wave abnormalities: normal Conduction Disturbances: none Narrative Interpretation: unremarkable    Radiology: * "Dg Chest Port 1 View  Result Date: 09/22/2016 CLINICAL DATA:  Bradycardia, short of breath EXAM: PORTABLE CHEST 1 VIEW COMPARISON:  08/27/2016 FINDINGS: Chronic volume loss in the left lung unchanged. Fibrosis in the left lung also unchanged. Hyperinflation and emphysematous changes in the right lung. Negative for heart failure. Negative for pneumonia or effusion. Left coronary stent. Atherosclerotic aorta. IMPRESSION: Chronic lung disease without interval change. Electronically Signed   By: Franchot Gallo  M.D.   On: 09/22/2016 16:05  "  I personally reviewed the radiologic studies    Critical Care:  CRITICAL CARE Performed by: Daymon Larsen   Total critical care time: 53 minutes  Critical care time was exclusive of separately billable procedures and treating other patients.  Critical care was necessary to treat or prevent imminent or life-threatening deterioration.  Critical care was time spent personally by me on the following activities: development of treatment plan with patient and/or surrogate as well as nursing, discussions with consultants, evaluation of patient's response to treatment, examination of patient, obtaining history from patient or surrogate, ordering and performing treatments and interventions, ordering and review of laboratory studies, ordering and review of radiographic studies, pulse oximetry and re-evaluation of patient's condition. Initial management of syncope with third-degree heart block   ED Course:  Patient was placed in a bed in the supine position. The appropriate monitors and was placed  on supplemental oxygen. His 80s very bradycardic and once IV access was established patient was given epinephrine with good response and his heart rate eventually studied out in the low 80s and then gradually decreased to what appears to be a third-degree heart block. The patient's blood pressure came up after fluids and after the epinephrine overall his mental status is definitely improved. Patient is a DO NOT RESUSCITATE though the patient and the family agree for chemical and possibly pacemaker intervention. We attempted a brief episode of pacing the patient just to see if it would be tolerated and the patient felt extreme discomfort and I felt was not the wrist to sedate the patient just in order to pace him at this time as he is reach some form of hemodynamic stability. His case was briefly reviewed with cardiology on-call and he'll be admitted to the CCU likely for  elective pacemaker placement for what appears to be sick sinus syndrome. Heart rate remained primarily in the low 30s to high 30s Clinical Course      Assessment: * Syncope Third-degree heart block Sick sinus syndrome   Final Clinical Impression:   Final diagnoses:  Heart block AV third degree [facility])     Plan:  Inpatient management           Daymon Larsen, MD 09/22/16 1902

## 2016-09-22 NOTE — ED Triage Notes (Signed)
Pt was bradycardic and hypotensive on arrival from [place] and near syncope. Brought straight back to room 26.  Given epi after iv established. Pacer pads in place. Pt now alert and talking. States he has had 5 heart attacks and was seeing dr. Sabra Heck to for physical

## 2016-09-22 NOTE — Progress Notes (Signed)
Anticoagulation monitoring(Lovenox):  82 yo  male ordered Lovenox 40 mg Q24h  Filed Weights   09/22/16 1545  Weight: 110 lb (49.9 kg)   BMI    Lab Results  Component Value Date   CREATININE 1.10 09/22/2016   CREATININE 0.65 09/16/2015   CREATININE 0.74 09/14/2015   Estimated Creatinine Clearance: 29 mL/min (by C-G formula based on SCr of 1.1 mg/dL). Hemoglobin & Hematocrit     Component Value Date/Time   HGB 11.5 (L) 09/22/2016 1550   HGB 11.7 (L) 06/06/2012 1128   HCT 34.8 (L) 09/22/2016 1550   HCT 33.7 (L) 06/06/2012 1128     Per Protocol for Patient with estCrcl < 30 ml/min and BMI < 40, will transition to Lovenox 30 mg Q24h.

## 2016-09-22 NOTE — H&P (Signed)
[place] [facility] [place] NAME: Nicholas Osborn    MR#:  [id number]  DATE OF BIRTH:  1920-03-14  DATE OF ADMISSION:  09/22/2016  PRIMARY CARE PHYSICIAN: Rusty Aus, MD   REQUESTING/REFERRING PHYSICIAN: Dr. Marcelene Butte  CHIEF COMPLAINT:   I'm not feeling well HISTORY OF PRESENT ILLNESS:  Nicholas Osborn  is a 82 y.o. male with a known history ofMI, left pontine CVA, impaired hearing, B12 deficiency, history of breast cancer, hyperlipidemia comes to the emergency room after he had a near syncopal episode from referral clinic. Patient's family has been monitoring his vital signs called coronary clinic primary care physician with heart rate into the 30s and 40s. He went to the clinic was found to be in the 30s and sent to the emergency room where EKG we will complete heart block. Patient was placed on external pacing how were he wasn't capturing the paced beats. He is asymptomatic at present blood pressure is stable and is mentating well. He is being admitted for complete heart block. Cardiology Dr. Nehemiah Massed is aware of patient being admitted. ER physician spoke with him. Patient is not on any rate blocking agent.  PAST MEDICAL HISTORY:   Past Medical History:  Diagnosis Date  . Cancer ([place])   . MI (myocardial infarction)     PAST SURGICAL HISTOIRY:  History reviewed. No pertinent surgical history.  SOCIAL HISTORY:   Social History  Substance Use Topics  . Smoking status: Never Smoker  . Smokeless tobacco: Never Used  . Alcohol use No    FAMILY HISTORY:  History reviewed. No pertinent family history.  DRUG ALLERGIES:  No Known Allergies  REVIEW OF SYSTEMS:  Review of Systems  Constitutional: Negative for chills, fever and weight loss.  HENT: Negative for ear discharge, ear pain and nosebleeds.   Eyes: Negative for blurred vision, pain and discharge.  Respiratory: Negative for sputum production, shortness of breath, wheezing and  stridor.   Cardiovascular: Negative for chest pain, palpitations, orthopnea and PND.  Gastrointestinal: Negative for abdominal pain, diarrhea, nausea and vomiting.  Genitourinary: Negative for frequency and urgency.  Musculoskeletal: Negative for back pain and joint pain.  Neurological: Positive for dizziness and weakness. Negative for sensory change, speech change and focal weakness.  Psychiatric/Behavioral: Negative for depression and hallucinations. The patient is not nervous/anxious.      MEDICATIONS AT HOME:   Prior to Admission medications   Medication Sig Start Date End Date Taking? Authorizing Provider  clotrimazole-betamethasone (LOTRISONE) cream apply topically once daily if needed 08/18/15   Historical Provider, MD  folic acid (FOLVITE) 1 MG tablet Take 1 mg by mouth daily. 09/06/15   Historical Provider, MD  nitroGLYCERIN (NITRODUR - DOSED IN MG/24 HR) 0.1 mg/hr patch Place 0.1 mg onto the skin daily. Leave on for 12-14 hours and then remove for 10-12 08/24/15   Historical Provider, MD  oxyCODONE-acetaminophen (PERCOCET) 7.5-325 MG tablet Take 1 tablet by mouth 4 (four) times daily. 09/09/15   Historical Provider, MD  tamsulosin (FLOMAX) 0.4 MG CAPS capsule Take 1 capsule by mouth daily. 09/06/15   Historical Provider, MD  temazepam (RESTORIL) 30 MG capsule Take 1 capsule by mouth at bedtime as needed. 08/20/15   Historical Provider, MD      VITAL SIGNS:  Blood pressure (!) 113/46, pulse 68, temperature 98.5 F (36.9 C), temperature source Rectal, resp. rate (!) 33, height 5\' 7"  (1.702 m), weight 49.9 kg (110 lb), SpO2 91 %.  PHYSICAL EXAMINATION:  GENERAL:  82 y.o.-year-old patient lying in the bed with no acute distress.  EYES: Pupils equal, round, reactive to light and accommodation. No scleral icterus. Extraocular muscles intact.  HEENT: Head atraumatic, normocephalic. Oropharynx and nasopharynx clear.  NECK:  Supple, no jugular venous distention. No thyroid enlargement,  no tenderness.  LUNGS: Normal breath sounds bilaterally, no wheezing, rales,rhonchi or crepitation. No use of accessory muscles of respiration.  CARDIOVASCULAR: S1, S2 normal. No murmurs, rubs, or gallops. Severe bradycardia ABDOMEN: Soft, nontender, nondistended. Bowel sounds present. No organomegaly or mass.  EXTREMITIES: No pedal edema, cyanosis, or clubbing.  NEUROLOGIC: Cranial nerves II through XII are intact. Muscle strength 5/5 in all extremities. Sensation intact. Gait not checked.  PSYCHIATRIC: The patient is alert and oriented x 3.  SKIN: No obvious rash, lesion, or ulcer.   LABORATORY PANEL:   CBC  Recent Labs Lab 09/22/16 1550  WBC 8.6  HGB 11.5*  HCT 34.8*  PLT 138*   ------------------------------------------------------------------------------------------------------------------  Chemistries   Recent Labs Lab 09/22/16 1550  NA 133*  K 4.7  CL 103  CO2 22  GLUCOSE 152*  BUN 22*  CREATININE 1.10  CALCIUM 8.5*  AST 24  ALT 12*  ALKPHOS 63  BILITOT 0.4   ------------------------------------------------------------------------------------------------------------------  Cardiac Enzymes  Recent Labs Lab 09/22/16 1550  TROPONINI 0.04*   ------------------------------------------------------------------------------------------------------------------  RADIOLOGY:  Dg Chest Port 1 View  Result Date: 09/22/2016 CLINICAL DATA:  Bradycardia, short of breath EXAM: PORTABLE CHEST 1 VIEW COMPARISON:  08/27/2016 FINDINGS: Chronic volume loss in the left lung unchanged. Fibrosis in the left lung also unchanged. Hyperinflation and emphysematous changes in the right lung. Negative for heart failure. Negative for pneumonia or effusion. Left coronary stent. Atherosclerotic aorta. IMPRESSION: Chronic lung disease without interval change. Electronically Signed   By: Franchot Gallo M.D.   On: 09/22/2016 16:05    EKG:  Complete third-degree heart block  IMPRESSION  AND PLAN:  Nicholas Osborn  is a 82 y.o. male with a known history ofMI, left pontine CVA, impaired hearing, B12 deficiency, history of breast cancer, hyperlipidemia comes to the emergency room after he had a near syncopal episode from referral clinic. Patient's family has been monitoring his vital signs called coronary clinic primary care physician with heart rate into the 30s and 40s.  1. Complete heart block -Admit to stepdown ICU -Cardiac enzymes 3 -Patient not on any rate blocking agents. Patient is placed  External pacing how were not capturing beats -Cardiology consultation with Dr. Nehemiah Massed -denies any shortness of breath or chest -We'll keep him nothing by mouth after midnight in case patient pacemaker  2. History of MI Hold off on aspirin in anticipation  3. History of prostate cancer -Continue Flomax  4. DVT prophylaxis subcutaneous   I spoke with patient's  SON Chales Salmon on the phone and updated him about patient's ndition   All the records are reviewed and case discussed with ED provider. Management plans discussed with the patient, family and they are in agreement.  CODE STATUS: DO NOT RESUSCITATE per patient and confirmed with son on the phone  TOTAL TIME TAKING CARE OF THIS PATIENT: 50 minutes.    Nicholas Osborn M.D on 09/22/2016 at 5:41 PM  Between 7am to 6pm - Pager - [phone]  After 6pm go to [email or web address] - password [email or web address] [place] [facility]  Office  [phone]  CC: Primary care physician; Rusty Aus, MD

## 2016-09-22 NOTE — Progress Notes (Addendum)
Dr. Jannifer Franklin notified of patients heart rate consistently in the 20's.  Order given to externally pace pt at a rate of 60.  Pacer pads on pt and zoll attached to pt.  ecg reading on machine.  zoll pacer rate set to 60 and got capture but pt was yelling and said please stop I don't ever want that done.  Morphine 2mg  given for pain. Pt adamant in stating that he does NOT want that ever done again and that he completely understands his heart may stop and he is ok with that.  zoll and pads removed from patient and will not attempt to externally pace pt again per patients request.

## 2016-09-22 NOTE — ED Notes (Signed)
Dr. Marcelene [place] made aware pt's hr 53. Atropine at bedside. Pt alert, oriented, with no complaints.

## 2016-09-23 DIAGNOSIS — L8990 Pressure ulcer of unspecified site, unspecified stage: Secondary | ICD-10-CM | POA: Insufficient documentation

## 2016-09-23 MED ORDER — DIPHENHYDRAMINE HCL 25 MG PO CAPS
25.0000 mg | ORAL_CAPSULE | Freq: Every evening | ORAL | Status: DC | PRN
  Administered 2016-09-23: 25 mg via ORAL
  Filled 2016-09-23: qty 1

## 2016-09-23 MED ORDER — SODIUM CHLORIDE 0.9 % IV SOLN
INTRAVENOUS | Status: DC
  Administered 2016-09-23 – 2016-09-24 (×3): via INTRAVENOUS

## 2016-09-23 MED ORDER — LORAZEPAM 2 MG/ML IJ SOLN
0.5000 mg | Freq: Once | INTRAMUSCULAR | Status: AC
  Administered 2016-09-23: 0.5 mg via INTRAVENOUS
  Filled 2016-09-23: qty 1

## 2016-09-23 NOTE — Consult Note (Signed)
[facility] Consultation Note  Patient ID: Nicholas Osborn, MRN: [id number], DOB/AGE: [birth year] 82 y.o. Admit date: 09/22/2016   Date of Consult: 09/23/2016 Primary Physician: Rusty Aus, MD Primary Cardiologist:None  Chief Complaint:  Chief Complaint  Patient presents with  . Bradycardia  . Hypotension   Reason for Consult: syncope with heart block  HPI: 82 y.o. male with mild amount of dementia and known previous myocardial infarction mixed hyperlipidemia and previous cerebrovascular accident who is had an acute episode of syncope or in recent days. The patient did have some passed out spell but did not hurt himself and did last for a short period of time. The patient did not have any other SL spells although has been very weak and fatigued and unable to do the physical activity he wishes. When seen in the emergency room the patient did have an EKG showing sinus bradycardia with complete heart block. He did have a junctional escape of the heart rate of 35-40 bpm and is remaining that way over the last 24 hours. There has been no improvements of his heart rate. We have discussed with family and the patient to extended degree of the consequences of this complete heart block and the need for possible dual-chamber pacemaker placement. The patient currently wishes not to to proceed at this time although may not have the full faculty of his decisions at this time. The patient also will need further evaluation and treatment options including the possibility of making sure he does not have any recurrent urinary tract infection or other abnormality causes.  Past Medical History:  Diagnosis Date  . Cancer ([place])   . MI (myocardial infarction)       Surgical History: History reviewed. No pertinent surgical history.   Home Meds: Prior to Admission medications   Medication Sig Start Date End Date Taking? Authorizing Provider  nitroGLYCERIN (NITRODUR - DOSED IN MG/24 HR) 0.1 mg/hr  patch Place 1 patch onto the skin daily. Place 1 patch onto the skin once daily. Leave patches on for 12-14 hours, then remove for 10-12 hours prior to applying next patch. 09/12/16  Yes Historical Provider, MD  nitroGLYCERIN (NITROSTAT) 0.4 MG SL tablet Place 1 tablet under the tongue every 5 (five) minutes. 09/12/16  Yes Historical Provider, MD  oxyCODONE-acetaminophen (PERCOCET) 7.5-325 MG tablet Take 1 tablet by mouth 4 (four) times daily. 09/12/16  Yes Historical Provider, MD  tamsulosin (FLOMAX) 0.4 MG CAPS capsule Take 1 capsule by mouth daily. 09/06/15  Yes Historical Provider, MD  temazepam (RESTORIL) 30 MG capsule Take 1 capsule by mouth at bedtime as needed. 08/20/15  Yes Historical Provider, MD  clotrimazole-betamethasone (LOTRISONE) cream apply topically once daily if needed 08/18/15   Historical Provider, MD    Inpatient Medications:  . enoxaparin (LOVENOX) injection  30 mg Subcutaneous Q24H  . folic acid  1 mg Oral Daily  . mouth rinse  15 mL Mouth Rinse BID  . oxyCODONE-acetaminophen  1 tablet Oral QID  . sodium chloride flush  3 mL Intravenous Q12H  . tamsulosin  0.4 mg Oral Daily   . sodium chloride 75 mL/hr at 09/23/16 1400    Allergies: No Known Allergies  Social History   Social History  . Marital status: Single    Spouse name: N/A  . Number of children: N/A  . Years of education: N/A   Occupational History  . Not on file.   Social History Main Topics  . Smoking status: Never Smoker  .  Smokeless tobacco: Never Used  . Alcohol use No  . Drug use: No  . Sexual activity: Not on file   Other Topics Concern  . Not on file   Social History Narrative  . No narrative on file     History reviewed. No pertinent family history.   Review of Systems Positive forDizziness and syncope Negative for: General:  chills, fever, night sweats or weight changes.  Cardiovascular: PND orthopnea positive for syncope dizziness  Dermatological skin lesions  rashes Respiratory: Cough congestion Urologic: Frequent urination urination at night and hematuria Abdominal: negative for nausea, vomiting, diarrhea, bright red blood per rectum, melena, or hematemesis Neurologic: negative for visual changes, and/or hearing changes  All other systems reviewed and are otherwise negative except as noted above.  Labs:  Recent Labs  09/22/16 1550  TROPONINI 0.04*   Lab Results  Component Value Date   WBC 8.6 09/22/2016   HGB 11.5 (L) 09/22/2016   HCT 34.8 (L) 09/22/2016   MCV 102.3 (H) 09/22/2016   PLT 138 (L) 09/22/2016    Recent Labs Lab 09/22/16 1550  NA 133*  K 4.7  CL 103  CO2 22  BUN 22*  CREATININE 1.10  CALCIUM 8.5*  PROT 6.2*  BILITOT 0.4  ALKPHOS 63  ALT 12*  AST 24  GLUCOSE 152*   No results found for: CHOL, HDL, LDLCALC, TRIG No results found for: DDIMER  Radiology/Studies:  Dg Chest Port 1 View  Result Date: 09/22/2016 CLINICAL DATA:  Bradycardia, short of breath EXAM: PORTABLE CHEST 1 VIEW COMPARISON:  08/27/2016 FINDINGS: Chronic volume loss in the left lung unchanged. Fibrosis in the left lung also unchanged. Hyperinflation and emphysematous changes in the right lung. Negative for heart failure. Negative for pneumonia or effusion. Left coronary stent. Atherosclerotic aorta. IMPRESSION: Chronic lung disease without interval change. Electronically Signed   By: Franchot Gallo M.D.   On: 09/22/2016 16:05    [id number] bradycardia with complete heart block and junctional escape  Weights: Filed Weights   09/22/16 1545 09/22/16 2200  Weight: 49.9 kg (110 lb) 56.1 kg (123 lb 10.9 oz)     Physical Exam: Blood pressure (!) 117/49, pulse 83, temperature 98.2 F (36.8 C), temperature source Oral, resp. rate 17, height 5\' 7"  (1.702 m), weight 56.1 kg (123 lb 10.9 oz), SpO2 94 %. Body mass index is 19.37 kg/m. General: Well developed, well nourished, in no acute distress. Head eyes ears nose throat: Normocephalic,  atraumatic, sclera non-icteric, no xanthomas, nares are without discharge. No apparent thyromegaly and/or mass  Lungs: Normal respiratory effort.  no wheezes, no rales, no rhonchi.  Heart:Irregular with normal S1 S2. no murmur gallop, no rub, PMI is normal size and placement, carotid upstroke normal without bruit, jugular venous pressure is normal Abdomen: Soft, non-tender, non-distended with normoactive bowel sounds. No hepatomegaly. No rebound/guarding. No obvious abdominal masses. Abdominal aorta is normal size without bruit Extremities: Trace edema. no cyanosis, no clubbing, no ulcers  Peripheral : 2+ bilateral upper extremity pulses, 2+ bilateral femoral pulses, 2+ bilateral dorsal pedal pulse Neuro: Alert and oriented. No facial asymmetry. No focal deficit. Moves all extremities spontaneously. Musculoskeletal: Normal muscle tone without kyphosis Psych:  Responds to questions appropriately with a normal affect.    Assessment: 82 year old mildly demented male with mixed hyperlipidemia old myocardial infarction post cerebrovascular accident in the remote past with episode of syncope and complete heart block  Plan: 1. No further medication management for hypertension and her heart rate control due to possible  exacerbation of heart block 2. Patient has been advised for the possibility of dual-chamber pacemaker placement for further treatment of complete heart block and concerns of syncope. Patient understands risk and benefits of cardiac pacemaker placement this includes a possibility does stroke heart attack infection bleeding blood clot and pneumothorax hemopericardium. The patient is at low risk for conscious sedation 3. Further discussion with family and need for above over the next day or 2 if able 4. Further echocardiogram and/or other assessments of other causes of listed above  Signed, Corey Skains M.D. [facility] 09/23/2016, 3:25 PM

## 2016-09-23 NOTE — Progress Notes (Signed)
Patient ID: Nicholas Osborn, male   DOB: 1919/12/16, 82 y.o.   MRN: [id number]   [place] [place] NAME: Nicholas Osborn    MR#:  [id number]  DATE OF BIRTH:  04-12-1920  SUBJECTIVE:  CHIEF COMPLAINT:   Chief Complaint  Patient presents with  . Bradycardia  . Hypotension   Patient seen at bedside. Chart reviewed and care assumed. Patient denies all complaints of chest pain, shortness of breath, dizziness, lightheadedness, palpitations, nausea, vomiting. He is only complaint is that he is hungry. REVIEW OF SYSTEMS:  ROS  Constitutional: Negative for chills, fever and weight loss.  HENT: Negative for ear discharge, ear pain and nosebleeds.   Eyes: Negative for blurred vision, pain and discharge.  Respiratory: Negative for sputum production, shortness of breath, wheezing and stridor.   Cardiovascular: Negative for chest pain, palpitations, orthopnea and PND.  Gastrointestinal: Negative for abdominal pain, diarrhea, nausea and vomiting.  Genitourinary: Negative for frequency and urgency.  Musculoskeletal: Negative for back pain and joint pain.  Neurological: . Negative for sensory change, speech change and focal weakness.  Psychiatric/Behavioral: Negative for depression and hallucinations. The patient is not nervous/anxious.    DRUG ALLERGIES:  No Known Allergies VITALS:  Blood pressure (!) 117/49, pulse 83, temperature 98.2 F (36.8 C), temperature source Oral, resp. rate 17, height 5\' 7"  (1.702 m), weight 56.1 kg (123 lb 10.9 oz), SpO2 94 %. PHYSICAL EXAMINATION:  Physical Exam  GENERAL:  82 y.o.-year-old patient lying in the bed with no acute distress.  EYES: Pupils equal, round, reactive to light and accommodation. No scleral icterus. Extraocular muscles intact.  HEENT: Head atraumatic, normocephalic. Oropharynx and nasopharynx clear.  NECK:  Supple, no jugular venous distention. No thyroid enlargement, no tenderness.  LUNGS: Normal  breath sounds bilaterally, no wheezing, rales,rhonchi or crepitation. No use of accessory muscles of respiration.  CARDIOVASCULAR: S1, S2 normal. No murmurs, rubs, or gallops. Severe bradycardia ABDOMEN: Soft, nontender, nondistended. Bowel sounds present. No organomegaly or mass.  EXTREMITIES: No pedal edema, cyanosis, or clubbing.  NEUROLOGIC: Cranial nerves II through XII are intact. Muscle strength 5/5 in all extremities. Sensation intact. Gait not checked.  PSYCHIATRIC: The patient is alert and oriented x 3.  SKIN: No obvious rash, lesion, or ulcer.  LABORATORY PANEL:   CBC  Recent Labs Lab 09/22/16 1550  WBC 8.6  HGB 11.5*  HCT 34.8*  PLT 138*   ------------------------------------------------------------------------------------------------------------------ Chemistries   Recent Labs Lab 09/22/16 1550  NA 133*  K 4.7  CL 103  CO2 22  GLUCOSE 152*  BUN 22*  CREATININE 1.10  CALCIUM 8.5*  MG 1.8  AST 24  ALT 12*  ALKPHOS 63  BILITOT 0.4   RADIOLOGY:  Dg Chest Port 1 View  Result Date: 09/22/2016 CLINICAL DATA:  Bradycardia, short of breath EXAM: PORTABLE CHEST 1 VIEW COMPARISON:  08/27/2016 FINDINGS: Chronic volume loss in the left lung unchanged. Fibrosis in the left lung also unchanged. Hyperinflation and emphysematous changes in the right lung. Negative for heart failure. Negative for pneumonia or effusion. Left coronary stent. Atherosclerotic aorta. IMPRESSION: Chronic lung disease without interval change. Electronically Signed   By: Franchot Gallo M.D.   On: 09/22/2016 16:05   ASSESSMENT AND PLAN:   Nicholas Osborn  is a 82 y.o. male with a known history of MI, left pontine CVA, impaired hearing, B12 deficiency, history of cancer, hyperlipidemia admitted on 09/22/2016 with complete heart block  1. Complete heart block Cardiology consultation  and recommendations appreciated. After lengthy discussion with the patient's son and the patient himself and is  could've the patient does not want a pacemaker placed. We agreed that we would observe him overnight and the plan will be to discharge him to home as the family has 24-hour care already in place. Patient is not on any beta blocker or rate controlling medications.  2. History of MI Hold off on aspirin in anticipation  3. History of prostate cancer -Continue Flomax  4. DVT prophylaxis subcutaneous   I spoke with patient's  SON Nicholas Osborn on the phone and updated him about patient's condition   All the records are reviewed and case discussed with ED provider. Management plans discussed with the patient, family and they are in agreement.  CODE STATUS: DO NOT RESUSCITATE per patient and confirmed with son on the phone  TOTAL TIME TAKING CARE OF THIS PATIENT: 30 minutes.   More than 50% of the time was spent in counseling/coordination of care: YES  POSSIBLE D/C IN 1 DAYS, DEPENDING ON CLINICAL CONDITION.   Nicholas Osborn D.O. on 09/23/2016 at 3:31 PM  Between 7am to 6pm - Pager - [phone]  After 6pm go to [email or web address] - Proofreader  [facility]  Office  [phone]  CC: Primary care physician; Rusty Aus, MD  Note: This dictation was prepared with Dragon dictation along with smaller phrase technology. Any transcriptional errors that result from this process are unintentional.

## 2016-09-23 NOTE — Care Management (Signed)
Patient admitted in complete heart block.  Patient son at bedside.  Patient has made the decision not to pursue placement of pacemaker.  Plan to observe over night and discharge tomorrow.  RNCM consult for Home hospice services.  Patient lives in his on home.  24/7 CNA care in the home.  Patient has a hospital bed and hoverround in the home.  Patient son states that he visits daily.  PCP Emily Filbert.  Pharmacy [facility].  I have offered patient and family hospice agency preference.  Son states that they do not have a preference.  I have made the referral to [facility] with [facility].  Updated on plan for tomorrow. RNCM available for discharge needs.

## 2016-09-23 NOTE — Progress Notes (Signed)
New referral for [facility] services at home following discharge received from [place]. Nicholas Osborn is a 82 year old man with a significant cardiac history, recently discharged home from a rehab facility who was admitted to [facility] on 11/16 for evaluation of a near syncopal episode with heart rates in the 30's at his physician's office. In the ED an EKG showed he was in complete heart block, external pacing was unsuccessful. He was admitted to the ICU. Per chart note review and discussion with [facility] Colletta [place] and patient's son Nicholas Osborn. Patient has declined pacemaker placement. Al would like to take his father home to focus on his comfort with hospice services.  Writer spoke in the room with Nicholas Osborn. He confirmed his choice to take his father home, his mother received hospice services and he is familiar with the philosophy and team approach to care. Hospice information and contact number given to Al. Plan is for patient to discharge home tomorrow via EMS with signed portable DNR in place. Patient will need a signed prescription for liquid morphine 20 mg/ml, dose 5 mg/0.25 ml q 2 hours PRN, dispense 30 ml. Staff RN made aware. Patient's son is in agreement with this. Patient information faxed to hospice referral. Thank you for the opportunity to be involved in the care of this patient. Flo Shanks RN, BSN, [facility] [place], hospital liaison [phone] c

## 2016-09-23 NOTE — Progress Notes (Signed)
Palliative Medicine Consult Order Noted. Due limited staffing, there will be a delay seeing this patient. Palliative Medicine Provider will return to [facility] on Monday 09/26/2016, and patient will be evaluated then. Please call the Palliative Medicine Team office at [phone] if recommendations are needed in the interim.  Thank you for inviting [place] to see this patient.

## 2016-09-24 MED ORDER — ATROPINE SULFATE 1 % OP SOLN
4.0000 [drp] | OPHTHALMIC | Status: DC | PRN
  Filled 2016-09-24: qty 2

## 2016-09-24 MED ORDER — ALBUTEROL SULFATE (2.5 MG/3ML) 0.083% IN NEBU: 2.5000 mg | INHALATION_SOLUTION | RESPIRATORY_TRACT | Status: DC | PRN

## 2016-09-24 MED ORDER — DIPHENHYDRAMINE HCL 25 MG PO CAPS: 25.0000 mg | ORAL_CAPSULE | Freq: Every evening | ORAL | 0 refills | Status: AC | PRN

## 2016-09-24 MED ORDER — LOPERAMIDE HCL 2 MG PO CAPS: 2.0000 mg | ORAL_CAPSULE | ORAL | Status: DC | PRN

## 2016-09-24 MED ORDER — HALOPERIDOL 0.5 MG PO TABS: 0.5000 mg | ORAL_TABLET | ORAL | 0 refills | Status: DC | PRN

## 2016-09-24 MED ORDER — FLEET ENEMA 7-19 GM/118ML RE ENEM: 1.0000 | ENEMA | Freq: Every day | RECTAL | 0 refills | Status: AC | PRN

## 2016-09-24 MED ORDER — HYDROMORPHONE HCL 1 MG/ML IJ SOLN
0.5000 mg | INTRAMUSCULAR | Status: DC | PRN
  Administered 2016-09-24 (×2): 0.5 mg via INTRAVENOUS
  Filled 2016-09-24 (×2): qty 1

## 2016-09-24 MED ORDER — BISACODYL 10 MG RE SUPP: 10.0000 mg | Freq: Every day | RECTAL | 0 refills | Status: AC | PRN

## 2016-09-24 MED ORDER — BIOTENE DRY MOUTH MT LIQD: 15.0000 mL | OROMUCOSAL | Status: DC | PRN

## 2016-09-24 MED ORDER — ONDANSETRON 4 MG PO TBDP: 4.0000 mg | ORAL_TABLET | Freq: Four times a day (QID) | ORAL | 0 refills | Status: AC | PRN

## 2016-09-24 MED ORDER — ACETAMINOPHEN 325 MG PO TABS: 650.0000 mg | ORAL_TABLET | Freq: Four times a day (QID) | ORAL | 0 refills | Status: AC | PRN

## 2016-09-24 MED ORDER — FLEET ENEMA 7-19 GM/118ML RE ENEM: 1.0000 | ENEMA | Freq: Every day | RECTAL | Status: DC | PRN

## 2016-09-24 MED ORDER — SENNA 8.6 MG PO TABS: 1.0000 | ORAL_TABLET | Freq: Every evening | ORAL | Status: DC | PRN

## 2016-09-24 MED ORDER — LORAZEPAM 2 MG/ML IJ SOLN
0.5000 mg | Freq: Once | INTRAMUSCULAR | Status: AC
  Administered 2016-09-24: 0.5 mg via INTRAVENOUS
  Filled 2016-09-24: qty 1

## 2016-09-24 MED ORDER — ORAL CARE MOUTH RINSE: 15.0000 mL | Freq: Two times a day (BID) | OROMUCOSAL | 0 refills | Status: AC

## 2016-09-24 MED ORDER — ATROPINE SULFATE 1 % OP SOLN: 4.0000 [drp] | OPHTHALMIC | 12 refills | Status: AC | PRN

## 2016-09-24 MED ORDER — LORAZEPAM 2 MG/ML IJ SOLN
1.0000 mg | INTRAMUSCULAR | Status: DC | PRN
  Administered 2016-09-24: 1 mg via INTRAVENOUS
  Filled 2016-09-24: qty 1

## 2016-09-24 MED ORDER — BISACODYL 10 MG RE SUPP: 10.0000 mg | Freq: Every day | RECTAL | Status: DC | PRN

## 2016-09-24 MED ORDER — ONDANSETRON HCL 4 MG/2ML IJ SOLN: 4.0000 mg | Freq: Four times a day (QID) | INTRAMUSCULAR | Status: DC | PRN

## 2016-09-24 MED ORDER — BIOTENE DRY MOUTH MT LIQD: 15.0000 mL | OROMUCOSAL | 0 refills | Status: AC | PRN

## 2016-09-24 MED ORDER — HALOPERIDOL 0.5 MG PO TABS: 0.5000 mg | ORAL_TABLET | ORAL | Status: DC | PRN

## 2016-09-24 MED ORDER — LORAZEPAM 0.5 MG PO TABS: 1.0000 mg | ORAL_TABLET | ORAL | Status: DC | PRN

## 2016-09-24 MED ORDER — LOPERAMIDE HCL 2 MG PO CAPS: 2.0000 mg | ORAL_CAPSULE | ORAL | 0 refills | Status: AC | PRN

## 2016-09-24 MED ORDER — ALBUTEROL SULFATE (2.5 MG/3ML) 0.083% IN NEBU: 2.5000 mg | INHALATION_SOLUTION | RESPIRATORY_TRACT | 12 refills | Status: AC | PRN

## 2016-09-24 MED ORDER — MORPHINE SULFATE (CONCENTRATE) 10 MG/0.5ML PO SOLN: 5.0000 mg | ORAL | 0 refills | Status: AC | PRN

## 2016-09-24 MED ORDER — LORAZEPAM 2 MG/ML PO CONC: 1.0000 mg | ORAL | 0 refills | Status: AC | PRN

## 2016-09-24 MED ORDER — SENNA 8.6 MG PO TABS: 1.0000 | ORAL_TABLET | Freq: Every evening | ORAL | 0 refills | Status: AC | PRN

## 2016-09-24 MED ORDER — ONDANSETRON 4 MG PO TBDP: 4.0000 mg | ORAL_TABLET | Freq: Four times a day (QID) | ORAL | Status: DC | PRN

## 2016-09-24 MED ORDER — ACETAMINOPHEN 650 MG RE SUPP: 650.0000 mg | Freq: Four times a day (QID) | RECTAL | Status: DC | PRN

## 2016-09-24 MED ORDER — HALOPERIDOL LACTATE 5 MG/ML IJ SOLN: 0.5000 mg | INTRAMUSCULAR | Status: DC | PRN

## 2016-09-24 MED ORDER — HALOPERIDOL LACTATE 2 MG/ML PO CONC
0.5000 mg | ORAL | Status: DC | PRN
  Filled 2016-09-24: qty 0.3

## 2016-09-24 MED ORDER — MORPHINE SULFATE (CONCENTRATE) 10 MG/0.5ML PO SOLN: 5.0000 mg | ORAL | Status: DC | PRN

## 2016-09-24 MED ORDER — LORAZEPAM 2 MG/ML PO CONC: 1.0000 mg | ORAL | Status: DC | PRN

## 2016-09-24 MED ORDER — ACETAMINOPHEN 325 MG PO TABS: 650.0000 mg | ORAL_TABLET | Freq: Four times a day (QID) | ORAL | Status: DC | PRN

## 2016-09-24 MED ORDER — MORPHINE SULFATE (CONCENTRATE) 10 MG/0.5ML PO SOLN
5.0000 mg | ORAL | Status: DC | PRN
Start: 2016-09-24 — End: 2016-09-24

## 2016-10-07 NOTE — Discharge Summary (Signed)
[name] [place] NAME: Nicholas Osborn    MR#:  [id number]  DATE OF BIRTH:  [birth year]  DATE OF ADMISSION:  09/22/2016   ADMITTING PHYSICIAN: Fritzi Mandes, MD  DATE OF DISCHARGE: Oct 21, 2016  PRIMARY CARE PHYSICIAN: Rusty Aus, MD   ADMISSION DIAGNOSIS:  Heart block AV third degree ([place]) [I44.2] DISCHARGE DIAGNOSIS:  Active Problems:   Complete heart block (HCC)   Pressure injury of skin  SECONDARY DIAGNOSIS:   Past Medical History:  Diagnosis Date  . Cancer ([place])   . MI (myocardial infarction)    HOSPITAL COURSE:  This is a 82 year old male with a past medical history significant for MI, left pontine CVA, hearing impairment, B12 deficiency, breast cancer, hyperlipidemia who was admitted through the emergency department after syncopal episode. He was found to be bradycardic for which an EKG revealed complete heart block. He was admitted to stepdown ICU with external pacing and cardiology consultation. Patient had refused pacing and declared himself a DO NOT RESUSCITATE. He was seen in consultation by cardiology who recommended permanent pacemaker placement. After discussing understanding of the risks and benefits of pacemaker placement patient again deferred. Further discussion was had with his son. Ultimately decision was made to make the patient comfort care. Due to patient's critical and terminal condition, and the fact that he is deferring pacemaker placement for complete heart block we have placed comfort care orders, social work has initiated the process of in-home hospice where he already has 24-hour care in place. The patient is being discharged to home with hospice on comfort care measures and DO NOT RESUSCITATE order in place. DISCHARGE CONDITIONS:  Complete heart block History of MI History of CVA History of B12 deficiency History of prostate cancer History of B12 deficiency History of hyperlipidemia CONSULTS OBTAINED:    Treatment Team:  Corey Skains, MD DRUG ALLERGIES:  No Known Allergies DISCHARGE MEDICATIONS:     Medication List    STOP taking these medications   nitroGLYCERIN 0.1 mg/hr patch Commonly known as:  NITRODUR - Dosed in mg/24 hr   nitroGLYCERIN 0.4 MG SL tablet Commonly known as:  NITROSTAT   oxyCODONE-acetaminophen 7.5-325 MG tablet Commonly known as:  PERCOCET   tamsulosin 0.4 MG Caps capsule Commonly known as:  FLOMAX   temazepam 30 MG capsule Commonly known as:  RESTORIL     TAKE these medications   acetaminophen 325 MG tablet Commonly known as:  TYLENOL Take 2 tablets (650 mg total) by mouth every 6 (six) hours as needed for mild pain (or Fever >/= 101).   albuterol (2.5 MG/3ML) 0.083% nebulizer solution Commonly known as:  PROVENTIL Take 3 mLs (2.5 mg total) by nebulization every 2 (two) hours as needed for wheezing.   antiseptic oral rinse Liqd Apply 15 mLs topically as needed for dry mouth.   mouth rinse Liqd solution 15 mLs by Mouth Rinse route 2 (two) times daily.   atropine 1 % ophthalmic solution Place 4 drops under the tongue every 4 (four) hours as needed (excessive secretions).   bisacodyl 10 MG suppository Commonly known as:  DULCOLAX Place 1 suppository (10 mg total) rectally daily as needed for moderate constipation.   clotrimazole-betamethasone cream Commonly known as:  LOTRISONE apply topically once daily if needed   diphenhydrAMINE 25 mg capsule Commonly known as:  BENADRYL Take 1 capsule (25 mg total) by mouth at bedtime as needed for sleep.   loperamide 2 MG capsule Commonly known  as:  IMODIUM Take 1 capsule (2 mg total) by mouth every 2 (two) hours as needed for diarrhea or loose stools.   LORazepam 2 MG/ML concentrated solution Commonly known as:  ATIVAN Place 0.5 mLs (1 mg total) under the tongue every 4 (four) hours as needed for anxiety.   morphine CONCENTRATE 10 MG/0.5ML Soln concentrated solution Take 0.25 mLs (5 mg  total) by mouth every 2 (two) hours as needed for moderate pain (or dyspnea).   ondansetron 4 MG disintegrating tablet Commonly known as:  ZOFRAN-ODT Take 1 tablet (4 mg total) by mouth every 6 (six) hours as needed for nausea.   senna 8.6 MG Tabs tablet Commonly known as:  SENOKOT Take 1 tablet (8.6 mg total) by mouth at bedtime as needed for mild constipation.   sodium phosphate 7-19 GM/118ML Enem Place 133 mLs (1 enema total) rectally daily as needed for severe constipation.            Durable Medical Equipment        Start     Ordered   October 20, 2016 0000  For home use only DME oxygen    Question Answer Comment  Mode or (Route) Nasal cannula   Liters per Minute 2   Frequency Continuous (stationary and portable oxygen unit needed)   Oxygen conserving device Yes   Oxygen delivery system Gas      10-20-16 0849       DISCHARGE INSTRUCTIONS:  Patient is being discharged to home with hospice DIET:  Regular diet DISCHARGE CONDITION:  Critical ACTIVITY:  Activity as tolerated OXYGEN:  Home Oxygen: Yes.    Oxygen Delivery: 2 liters/min via Patient connected to nasal cannula oxygen DISCHARGE LOCATION:  Home with hospice   If you experience worsening of your admission symptoms, develop shortness of breath, life threatening emergency, suicidal or homicidal thoughts you must seek medical attention immediately by calling 911 or calling your MD immediately  if symptoms less severe.  You Must read complete instructions/literature along with all the possible adverse reactions/side effects for all the Medicines you take and that have been prescribed to you. Take any new Medicines after you have completely understood and accpet all the possible adverse reactions/side effects.   Please note  You were cared for by a hospitalist during your hospital stay. If you have any questions about your discharge medications or the care you received while you were in the hospital after you are  discharged, you can call the unit and asked to speak with the hospitalist on call if the hospitalist that took care of you is not available. Once you are discharged, your primary care physician will handle any further medical issues. Please note that NO REFILLS for any discharge medications will be authorized once you are discharged, as it is imperative that you return to your primary care physician (or establish a relationship with a primary care physician if you do not have one) for your aftercare needs so that they can reassess your need for medications and monitor your lab values.    On the day of Discharge:  VITAL SIGNS:  Blood pressure (!) 42/33, pulse (!) 29, temperature 98.2 F (36.8 C), temperature source Axillary, resp. rate 16, height 5\' 7"  (1.702 m), weight 56.1 kg (123 lb 10.9 oz), SpO2 96 %. PHYSICAL EXAMINATION:  GENERAL:  82 y.o.-year-old patient lying in the bed with Mild acute distress. Intermittently moaning in pain. EYES: Pupils equal, round, reactive to light and accommodation. No scleral icterus. Extraocular muscles intact.  HEENT: Head atraumatic, normocephalic. Oropharynx and nasopharynx clear.  NECK:  Supple, no jugular venous distention. No thyroid enlargement, no tenderness.  LUNGS: Normal breath sounds bilaterally, no wheezing, rales,rhonchi or crepitation. No use of accessory muscles of respiration.  CARDIOVASCULAR: Severely bradycardic. No murmurs, rubs, or gallops.  ABDOMEN: Soft, non-tender, non-distended. Bowel sounds present. No organomegaly or mass.  EXTREMITIES: No pedal edema, cyanosis, or clubbing.  NEUROLOGIC: Cranial nerves II through XII are intact. Muscle strength 5/5 in all extremities. Sensation intact. Gait not checked.  PSYCHIATRIC: The patient is alert and oriented x 3.  SKIN: No obvious rash, lesion, or ulcer.  DATA REVIEW:   CBC  Recent Labs Lab 09/22/16 1550  WBC 8.6  HGB 11.5*  HCT 34.8*  PLT 138*    Chemistries   Recent Labs Lab  09/22/16 1550  NA 133*  K 4.7  CL 103  CO2 22  GLUCOSE 152*  BUN 22*  CREATININE 1.10  CALCIUM 8.5*  MG 1.8  AST 24  ALT 12*  ALKPHOS 6  BILITOT 0.4     Microbiology Results  Results for orders placed or performed during the hospital encounter of 09/22/16  MRSA PCR Screening     Status: None   Collection Time: 09/22/16  8:14 PM  Result Value Ref Range Status   MRSA by PCR NEGATIVE NEGATIVE Final    Comment:        The GeneXpert MRSA Assay (FDA approved for NASAL specimens only), is one component of a comprehensive MRSA colonization surveillance program. It is not intended to diagnose MRSA infection nor to guide or monitor treatment for MRSA infections.     RADIOLOGY:  No results found.   Management plans discussed with the patient, family and they are in agreement.  CODE STATUS:     Code Status Orders        Start     Ordered   09/25/2016 [id number]  Do not attempt resuscitation (DNR)  Continuous    Question Answer Comment  In the event of cardiac or respiratory ARREST Do not call a "code blue"   In the event of cardiac or respiratory ARREST Do not perform Intubation, CPR, defibrillation or ACLS   In the event of cardiac or respiratory ARREST Use medication by any route, position, wound care, and other measures to relive pain and suffering. May use oxygen, suction and manual treatment of airway obstruction as needed for comfort.      Sep 25, 2016 0843    Code Status History    Date Active Date Inactive Code Status Order ID Comments User Context   09/22/2016  8:13 PM September 25, 2016  8:43 AM DNR [id number]  Fritzi Mandes, MD Inpatient   09/14/2015  4:11 PM 09/16/2015  6:45 PM DNR [id number]  Fritzi Mandes, MD Inpatient    Advance Directive Documentation   Flowsheet Row Most Recent Value  Type of Advance Directive  Out of facility DNR (pink MOST or yellow form)  Pre-existing out of facility DNR order (yellow form or pink MOST form)  No data  "MOST" Form in Place?  No data        TOTAL TIME TAKING CARE OF THIS PATIENT: 40 minutes.    Harvie Bridge M.D on September 25, 2016 at 10:29 AM  Between 7am to 6pm - Pager - [phone]  After 6pm go to [email or web address] - Proofreader  [facility]  Office  [phone]  CC: Primary care physician; Rusty Aus, MD   Note: This dictation was  prepared with Dragon dictation along with smaller phrase technology. Any transcriptional errors that result from this process are unintentional.

## 2016-10-07 NOTE — Progress Notes (Signed)
[facility] Encounter Note  Patient: Nicholas Osborn / Admit Date: 09/22/2016 / Date of Encounter: 09-Oct-2016, 5:55 AM   Subjective: Patient somewhat disoriented. Patient is moaning but no evidence of significant true distress. No evidence of chest pain or congestive heart failure. Telemetry shows sinus bradycardia with complete heart block and junctional escape at 30 bpm Discussion with the family about pacemaker placement has been ensued with agreement of the family members although the patient has not particularly agreed. This may be secondary to some dementia. The patient does understand that some of his current symptoms of nausea and weakness and fatigue are secondary to his bradycardia and heart block and would be improved with pacemaker placement. Further consideration of proceeding to dual-chamber pacemaker placement if family and patient continued to be in agreement. The patient is a DO NOT RESUSCITATE and only previously does not wish to have any current interventions  Review of Systems: Positive for: Nausea Negative for: Vision change, hearing change, syncope, dizziness,  vomiting,diarrhea, bloody stool, stomach pain, cough, congestion, diaphoresis, urinary frequency, urinary pain,skin lesions, skin rashes Others previously listed  Objective: Telemetry: Sinus bradycardia with junctional escape Physical Exam: Blood pressure (!) 58/15, pulse (!) 29, temperature 98.2 F (36.8 C), temperature source Axillary, resp. rate 13, height 5\' 7"  (1.702 m), weight 56.1 kg (123 lb 10.9 oz), SpO2 96 %. Body mass index is 19.37 kg/m. General: Well developed, well nourished, in no acute distress. Head: Normocephalic, atraumatic, sclera non-icteric, no xanthomas, nares are without discharge. Neck: No apparent masses Lungs: Normal respirations with no wheezes, no rhonchi, no rales , no crackles   Heart: Regular rate and rhythm, normal S1 S2, no murmur, no rub, no gallop, PMI  is normal size and placement, carotid upstroke normal without bruit, jugular venous pressure normal Abdomen: Soft, non-tender, non-distended with normoactive bowel sounds. No hepatosplenomegaly. Abdominal aorta is normal size without bruit Extremities: Trace edema, no clubbing, no cyanosis, no ulcers,  Peripheral: 2+ radial, 2+ femoral, 2+ dorsal pedal pulses Neuro: Alert and oriented. Moves all extremities spontaneously. Psych:  Responds to questions appropriately with a normal affect.   Intake/Output Summary (Last 24 hours) at 10/09/16 0555 Last data filed at Oct 09, 2016 0500  Gross per 24 hour  Intake          2302.25 ml  Output                0 ml  Net          2302.25 ml    Inpatient Medications:  . enoxaparin (LOVENOX) injection  30 mg Subcutaneous Q24H  . folic acid  1 mg Oral Daily  . LORazepam  0.5 mg Intravenous Once  . mouth rinse  15 mL Mouth Rinse BID  . oxyCODONE-acetaminophen  1 tablet Oral QID  . sodium chloride flush  3 mL Intravenous Q12H  . tamsulosin  0.4 mg Oral Daily   Infusions:  . sodium chloride 75 mL/hr at 10-09-16 0235    Labs:  Recent Labs  09/22/16 1550  NA 133*  K 4.7  CL 103  CO2 22  GLUCOSE 152*  BUN 22*  CREATININE 1.10  CALCIUM 8.5*  MG 1.8    Recent Labs  09/22/16 1550  AST 24  ALT 12*  ALKPHOS 63  BILITOT 0.4  PROT 6.2*  ALBUMIN 2.9*    Recent Labs  09/22/16 1550  WBC 8.6  NEUTROABS 5.0  HGB 11.5*  HCT 34.8*  MCV 102.3*  PLT 138*  Recent Labs  09/22/16 1550  TROPONINI 0.04*   Invalid input(s): POCBNP No results for input(s): HGBA1C in the last 72 hours.   Weights: Filed Weights   09/22/16 1545 09/22/16 2200  Weight: 49.9 kg (110 lb) 56.1 kg (123 lb 10.9 oz)     Radiology/Studies:  Dg Chest Port 1 View  Result Date: 09/22/2016 CLINICAL DATA:  Bradycardia, short of breath EXAM: PORTABLE CHEST 1 VIEW COMPARISON:  08/27/2016 FINDINGS: Chronic volume loss in the left lung unchanged. Fibrosis in the  left lung also unchanged. Hyperinflation and emphysematous changes in the right lung. Negative for heart failure. Negative for pneumonia or effusion. Left coronary stent. Atherosclerotic aorta. IMPRESSION: Chronic lung disease without interval change. Electronically Signed   By: Franchot Gallo M.D.   On: 09/22/2016 16:05     Assessment and Recommendation  82 y.o. male with his history of myocardial infarction mixed hyperlipidemia and previous stroke with some dementia and syncope likely secondary to sinus bradycardia with complete heart block slightly progressed at this time 1. Continue supportive care and including hydration for pressure support 2. Consideration of addition of dopamine for pressure support as well as the possibility of increasing junctional escape rhythm to faster rhythm to improve symptoms if patient wishes to proceed on to dual-chamber pacemaker placement 3. Continued discussion with family and the patient to further consider the possibility of dual-chamber pacemaker placement  Signed, Serafina Royals M.D. FACC

## 2016-10-07 NOTE — Care Management Note (Signed)
Case Management Note  Patient Details  Name: Nicholas Osborn MRN: [id number] Date of Birth: 01-Jan-1920  Subjective/Objective:     Notified Nicholas Osborn's nurse Maudie Mercury that Hospice has the home oxygen set up in Nicholas Osborn's home, and that he is ready for transport to home via EMS. [name] to provide prescriptions for home liquid MS04 and Ativan PO.                Action/Plan:   Expected Discharge Date:                  Expected Discharge Plan:     In-House Referral:     Discharge planning Services     Post Acute Care Choice:    Choice offered to:     DME Arranged:    DME Agency:     HH Arranged:    HH Agency:     Status of Service:     If discussed at [employer] of Stay Meetings, dates discussed:    Additional Comments:  Amitai Delaughter A, RN 28-Sep-2016, 2:16 PM

## 2016-10-07 NOTE — Discharge Instructions (Signed)
Third-Degree Atrioventricular Block What is atrioventricular (AV) block?  Atrioventricular (AV) block, also known as heart block, is a problem with the system that controls how often the heart beats (heart rate)and the pattern of heart beats (heart rhythm). In this condition, the signals that travel from the hearts upper chambers (atria) to its lower chambers (ventricles) move too slowly or are interrupted.There are several types of heart block:  First-degree.  Second-degree.  Third-degree or complete. What is third-degree AV block?  Third-degree AV block, also called complete block, is the most serious type of heart block. In this condition, the signals that control heart rate are completely blocked. As a result, certain cells in the heart cause the ventricles to contract and pump blood (escape beats). These cells act as a sort of backup system. However, this backup system works at a much slower rate than normal, and it is not enough to keep your heart working well. What are the causes? This condition may be caused by:  Any condition that damages the system that controls the hearts rate and rhythm, such as a heart attack.  Overstimulation of the nerve that slows down heart rate (vagus nerve). This cause is common among well-conditioned athletes.  Some medicinesthat slow down the heart rate, such as beta blockers or calcium channel blockers.  Surgery that damages the heart. Some people are born with this condition (congenital heart block),but most people develop it over time. What increases the risk? The risk for this condition increases with age. You are also more likely to develop this condition if you have:  A history of heart attack.  Heart failure.  Coronary heart disease.  Inflammation of heart muscle (myocarditis).  Disease of heart muscle (cardiomyopathy).  Infection of the heart valves (endocarditis).  Infections or diseases that affect the heart. These  include:  Lyme disease.  Sarcoidosis.  Hemochromatosis.  Rheumatic fever.  Muscle disorders including Lev disease and Lenegre disease. Babies are more likely to be born with heart block if:  The mother has an autoimmune disease, such as lupus.  The baby is born with a heart defect that affects the hearts structure.  A parent was born with a heart defect. What are the signs or symptoms? Symptoms of this condition include:  Tiredness.  Shortness of breath.  Dizziness.  Light-headedness.  Fainting.  Chest pain. How is this diagnosed? This condition may be diagnosed based on:  A physical exam.  Your medical history.  A measurement of your pulse or heartbeat.  An electrocardiogram (ECG). This test is done to check for problems with electrical activity in the heart.  An electrophysiology (EP) study. This test involves having long, thin tubes (catheters) placed in the heart. The catheters are used to study the heart and record electrical signals in the heart. How is this treated? This condition must be treated right away at a hospital right. Treatment may involve:  Treating an underlying condition, such as heart disease.  Changing or stopping any heart medicines that can cause heart block.  Having a permanent pacemaker placed in the chest. A pacemaker is a small device that uses electrical pulses to help the heart beat normally. It is usually placed under the skin on the chest or abdomen. Follow these instructions at home: General instructions  Take over-the-counter and prescription medicines only as told by your health care provider.  Work with your health care provider to control lifestyle choices that increase your risk for heart disease. You may need to:  Get  regular exercise. Each week, try to get 150 minutes of moderate-intensity activity (such as walking or yoga) or 75 minutes of vigorous activity (such as running or swimming). Ask your health care provider  what type of exercise is safe for you.  Eat a heart-healthy diet with fruits and vegetables, whole grains, low-fat dairy products, and lean proteins like poultry and eggs. Your health care provider or diet and nutrition specialist (dietitian) can help you make healthy choices.  Maintain a healthy weight.  Limit alcohol intake to no more than 1 drink per day for nonpregnant women and 2 drinks per day for men. One drink equals 12 oz of beer, 5 oz of wine, or 1 oz of hard liquor.  Do not use any products that contain nicotine or tobacco, such as cigarettes and e-cigarettes. If you need help quitting, ask your health care provider.  Keep all follow-up visits as told by your health care provider. This is important. If you have a pacemaker:  Avoid spending long periods of time with electronic devices that have strong magnetic fields, such as cell phones, microwaves, or metal detectors.  Tell all health care providers who care for you that you have a pacemaker.  If you are flying, tell airport security that you have a pacemaker.  Consider wearing a medical ID bracelet or necklace stating that you have a pacemaker.  You will be given a card with information about your pacemaker. Carry this card with you at all times. Contact a health care provider if:  You feel like your heart is skipping beats.  You feel more tired than normal.  You have swelling in your lower legs or your feet. Get help right away if:  Your symptoms change or they get worse.  You develop new symptoms.  You have chest pain, especially if the pain:  Feels like crushing or pressure.  Spreads to your arms, back, neck, or jaw.  You feel short of breath.  You feel light-headed or weak.  You faint. Summary  Third-degree AV block, also called complete block, is the most serious type of heart block. In this condition, the signals that control heart rate are completely blocked.  A pacemaker is a small device that  uses electrical pulses to help the heart beat normally. It is usually placed under the skin on your chest or abdomen.  Third-degree heart block is a medical emergency, and it should be treated right away at a hospital. This information is not intended to replace advice given to you by your health care provider. Make sure you discuss any questions you have with your health care provider. Document Released: 10/06/2008 Document Revised: 06/10/2016 Document Reviewed: 06/10/2016 Elsevier Interactive Patient Education  2017 [employer].

## 2016-10-07 NOTE — Progress Notes (Signed)
Report to EMS. All questions answered. No further needs. D/C PIV - cath intact.

## 2016-10-07 NOTE — Progress Notes (Signed)
Called and notified MD that it is very hart to get an O2sat reading on patient as well as a blood pressure. MD informed me to call family member to notify them. Talked to patient's son who is also the power attorney at [phone]. The son did not want patient to be on comfort care right now. He said he wants to make that decision when he gets here at around 0800. Will continue to monitor patient's status very closely.

## 2016-10-07 NOTE — Care Management Note (Signed)
Case Management Note  Patient Details  Name: Nicholas Osborn MRN: [id number] Date of Birth: 23-Jun-1920  Subjective/Objective:     Discussed discharge plan for discharge home today with [facility] with [name] son, [name] at hospice,  and [name].  Once [facility] gets the new home oxygen set up in [name] home today, EMS can be called to transport [name] home on continuous 2L N/C. Per [name], [name] has a hospital bed at home and a 24/7 CNA caregiver. [name] will prescribe liquid MS04 and Ativan PO per request of Hospice.           Action/Plan:   Expected Discharge Date:                  Expected Discharge Plan:     In-House Referral:     Discharge planning Services     Post Acute Care Choice:    Choice offered to:     DME Arranged:    DME Agency:     HH Arranged:    HH Agency:     Status of Service:     If discussed at [employer] of Stay Meetings, dates discussed:    Additional Comments:  Absalom Aro A, RN October 06, 2016, 9:07 AM

## 2016-10-07 NOTE — Progress Notes (Signed)
[facility] Progress Note Patient Name: Nicholas Osborn DOB: Nov 15, 1919 MRN: [id number]   Date of Service  03-Oct-2016  HPI/Events of Note  Notified by bedside nurse patient with ongoing delirium and intermittent agitation calling out. Patient admitted by hospitalist service. Evaluated by hospice.   eICU Interventions  Bedside nurse to contact the attending physician for orders      Intervention Category Major Interventions: Delirium, psychosis, severe agitation - evaluation and management  Tera Partridge 10-03-16, 12:03 AM

## 2016-10-07 DEATH — deceased

## 2016-11-19 IMAGING — CT CT HEAD W/O CM
2 series · 13 of 30 positions shown, 15 images · non-contrast
Comparison: 06/06/2012

CLINICAL DATA: Fall, right head injury

EXAM:
CT HEAD WITHOUT CONTRAST
TECHNIQUE: Contiguous axial images were obtained from the base of the skull
through the vertex without intravenous contrast.

[Series 2: head wo · axial · 0.43mm/px · z∈[-47,+48]mm · 5 of 33 slices shown, 7 images]
[im 6/33  brain]
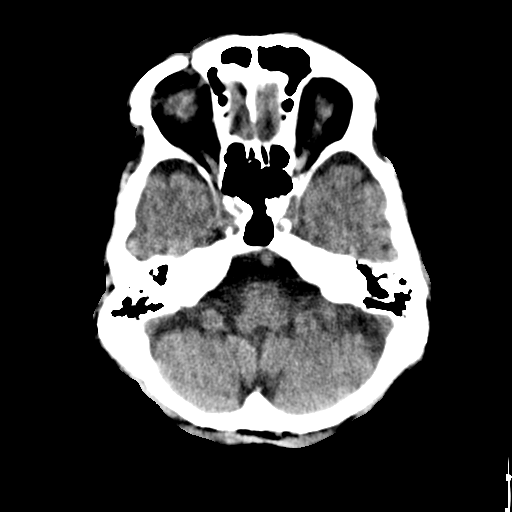
[im 6/33  bone]
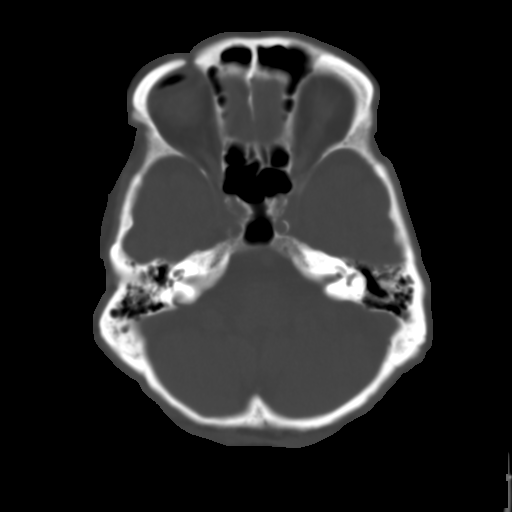
[im 11/33  brain]
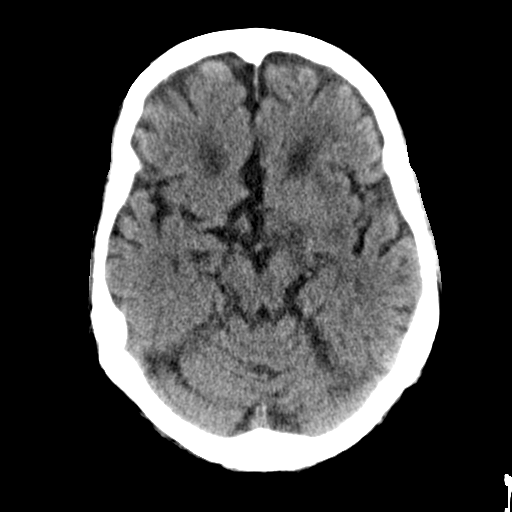
[im 17/33  brain]
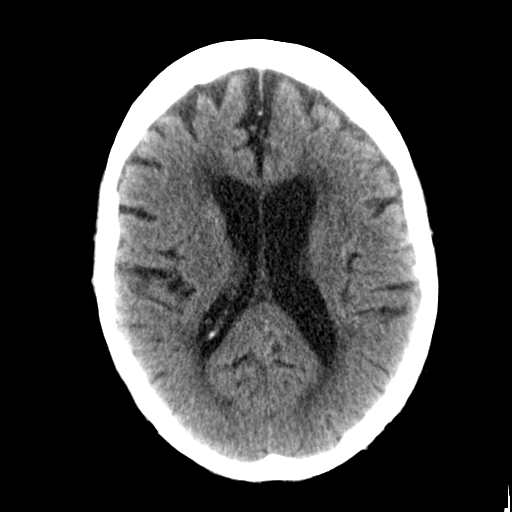
[im 22/33  brain]
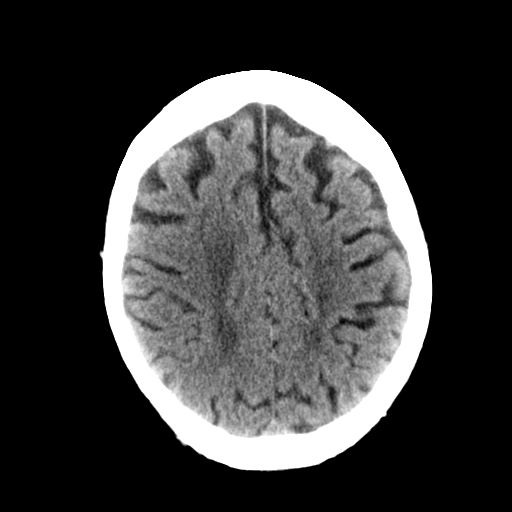
[im 27/33  brain]
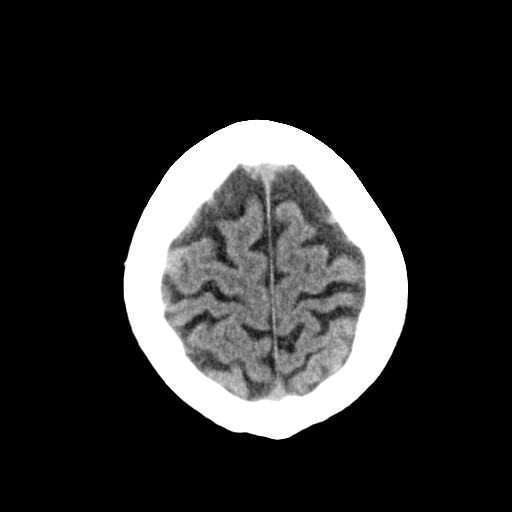
[im 27/33  bone]
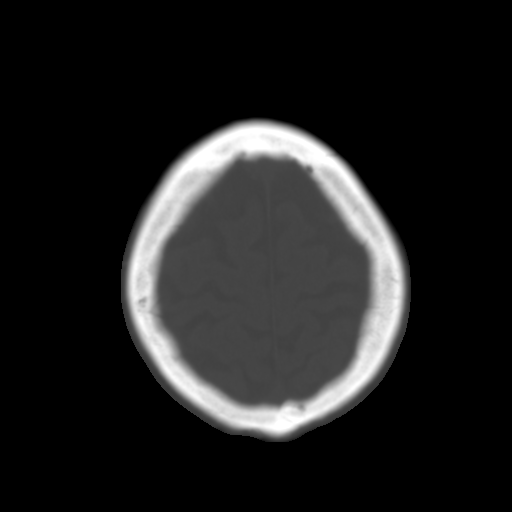

[Series 3: head bone · axial · 0.43mm/px · z∈[-57,+70]mm · 8 of 105 slices shown]
[im 10/105  bone]
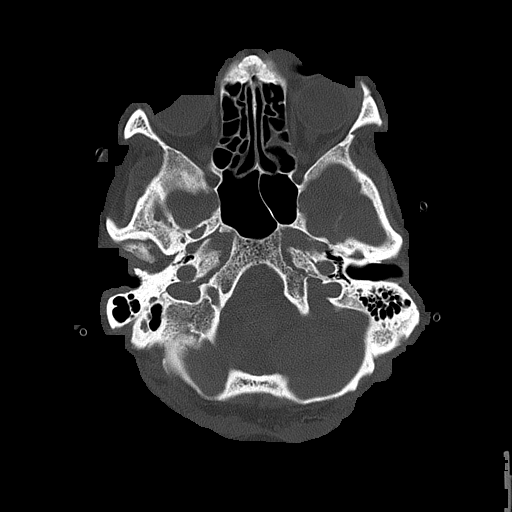
[im 19/105  bone]
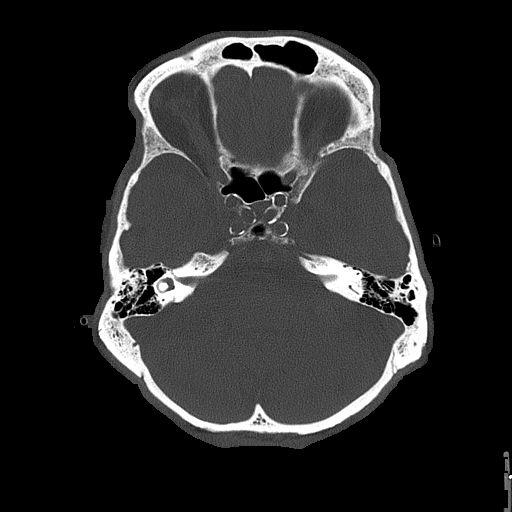
[im 34/105  bone]
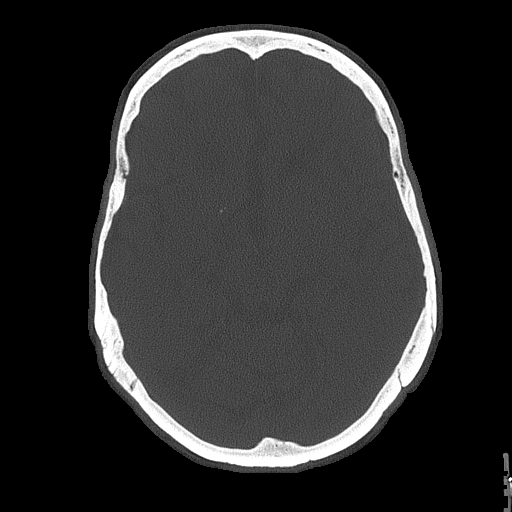
[im 48/105  bone]
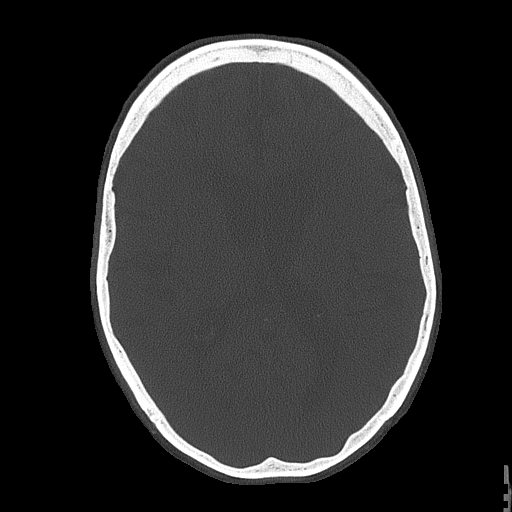
[im 57/105  bone]
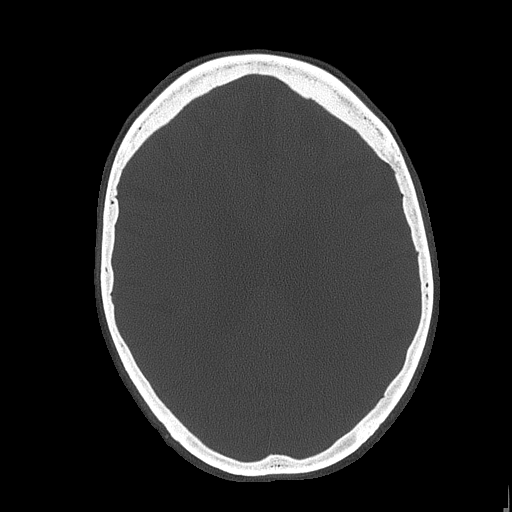
[im 71/105  bone]
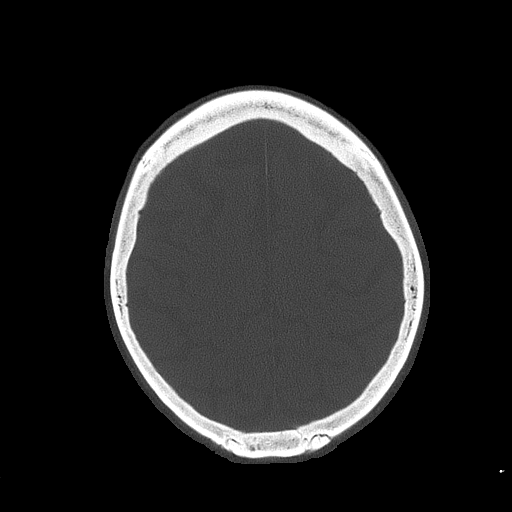
[im 86/105  bone]
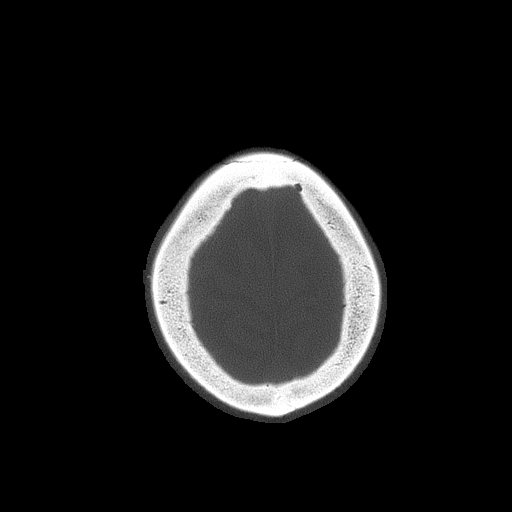
[im 95/105  bone]
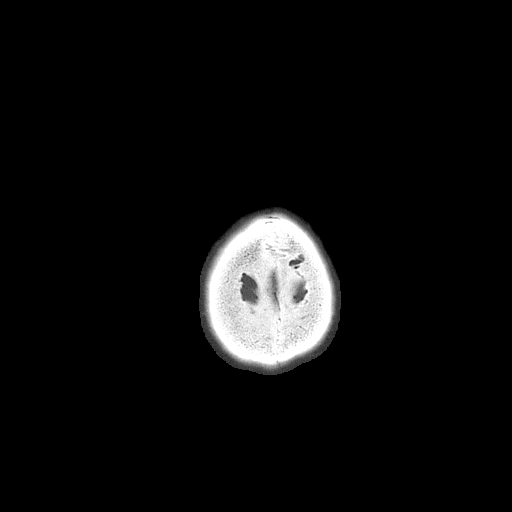

[13 of 30 positions shown; findings below may reference images not displayed]

FINDINGS: No evidence of parenchymal hemorrhage or extra-axial fluid
collection. No mass lesion, mass effect, or midline shift.

No CT evidence of acute infarction.

Subcortical white matter and periventricular small vessel ischemic
changes. Intracranial atherosclerosis.

Global cortical atrophy. No ventriculomegaly. The visualized
paranasal sinuses are essentially clear. The mastoid air cells are
unopacified.

No evidence of calvarial fracture.
IMPRESSION: No evidence of acute intracranial abnormality.

Atrophy with small vessel ischemic changes.

## 2016-11-19 IMAGING — CT CT ANGIO CHEST
1 of 2 series · 18 of 30 positions shown · IV contrast (APPLIED)
Comparison: Chest x-ray earlier today.

CLINICAL DATA: Shortness of breath, syncope, chest pain.

EXAM:
CT ANGIOGRAPHY CHEST WITH CONTRAST
TECHNIQUE: Multidetector CT imaging of the chest was performed using the
standard protocol during bolus administration of intravenous
contrast. Multiplanar CT image reconstructions and MIPs were
obtained to evaluate the vascular anatomy.
CONTRAST:  75mL OMNIPAQUE IOHEXOL 350 MG/ML SOLN

[Series 5: pe 1.0 thins · axial · 0.66mm/px · z∈[-762,-498]mm · 18 of 298 slices shown]
[im 17/298  lung]
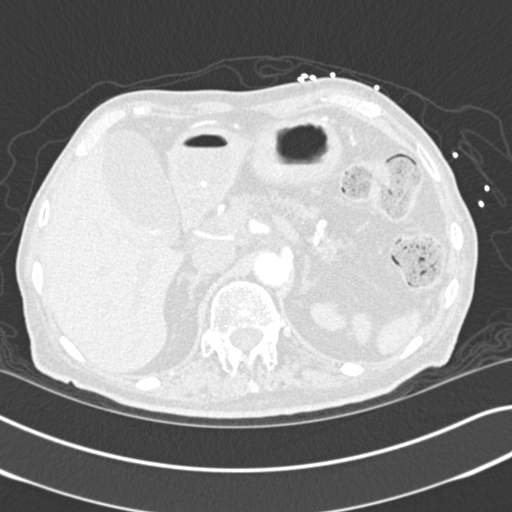
[im 34/298  mediastinal]
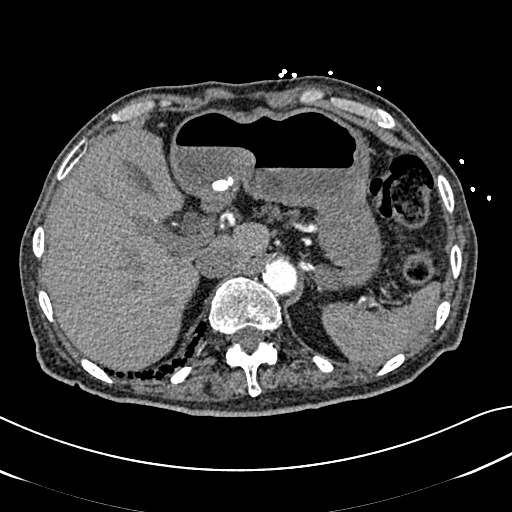
[im 50/298  lung]
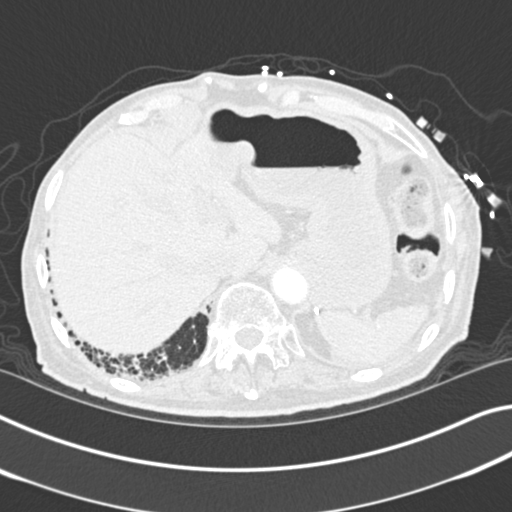
[im 67/298  mediastinal]
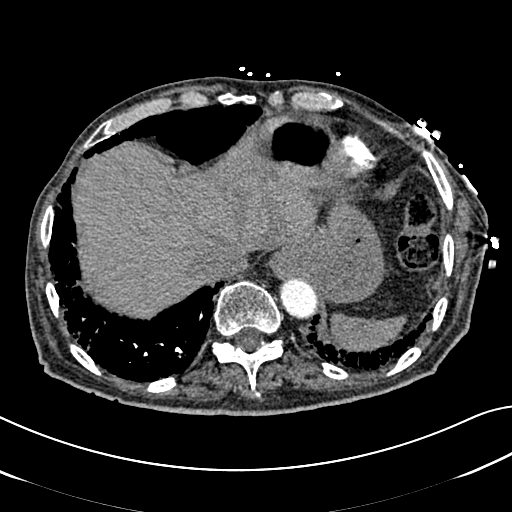
[im 83/298  lung]
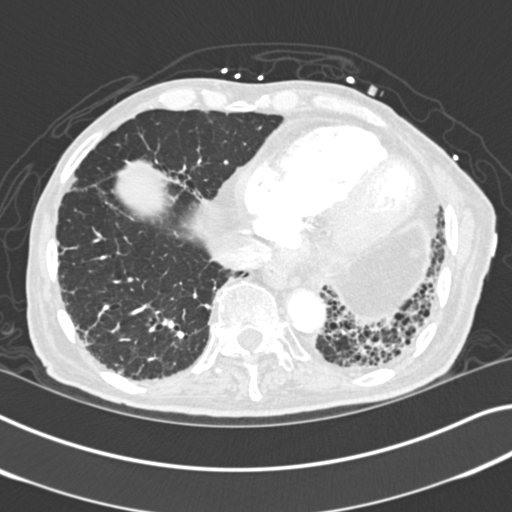
[im 100/298  mediastinal]
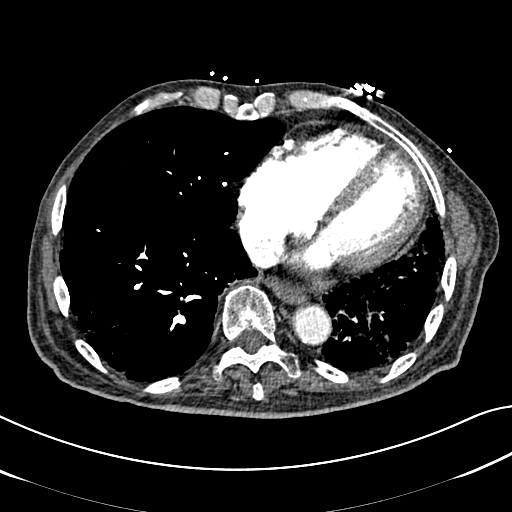
[im 116/298  lung]
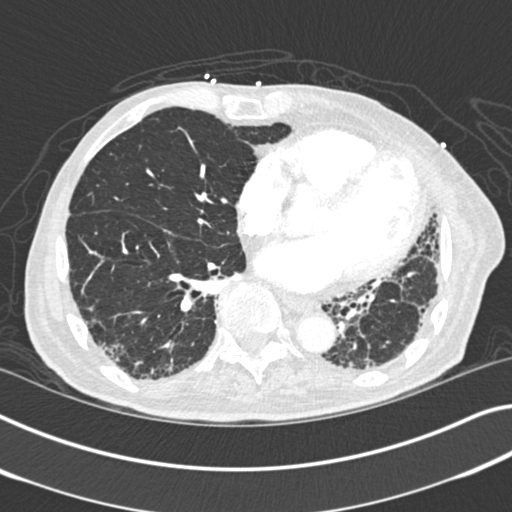
[im 133/298  mediastinal]
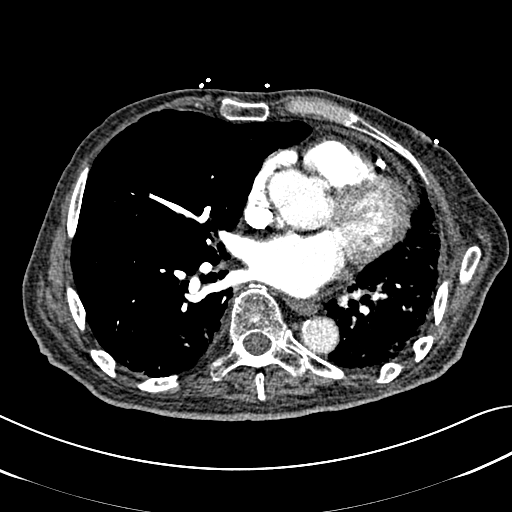
[im 140/298  lung]
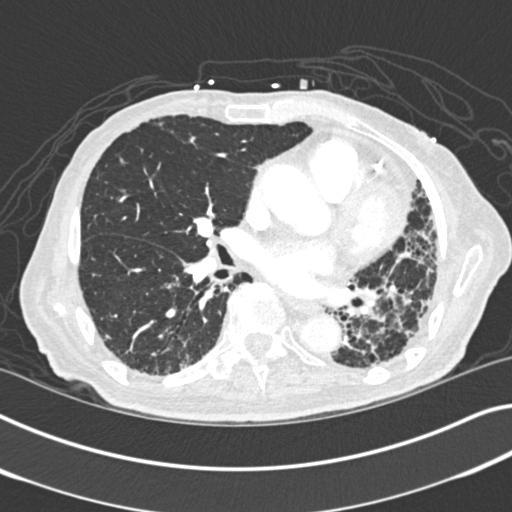
[im 149/298  mediastinal]
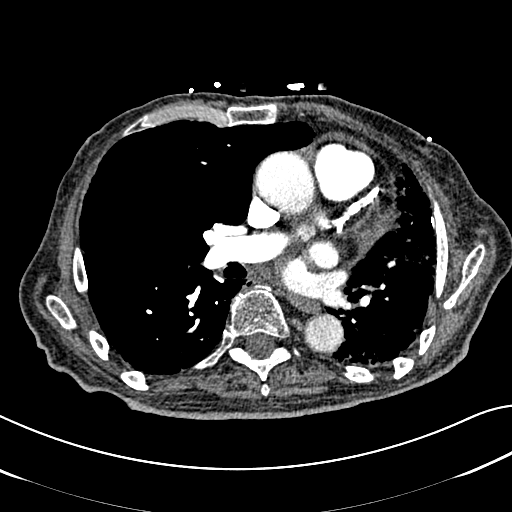
[im 166/298  lung]
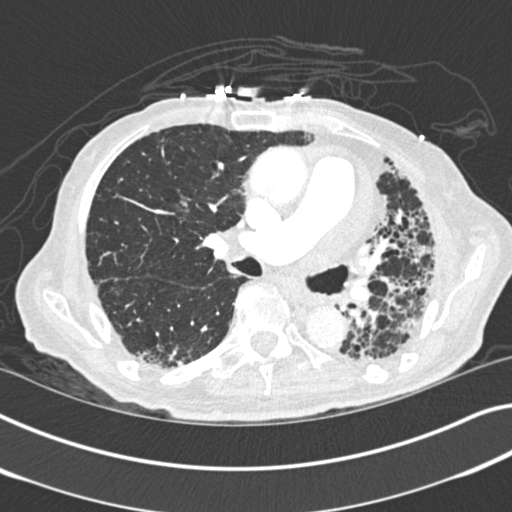
[im 182/298  mediastinal]
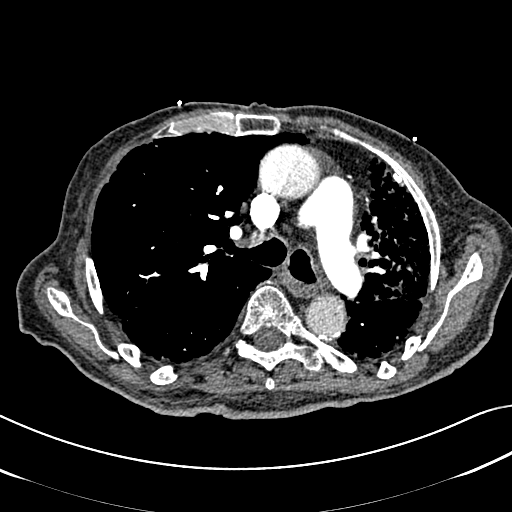
[im 199/298  lung]
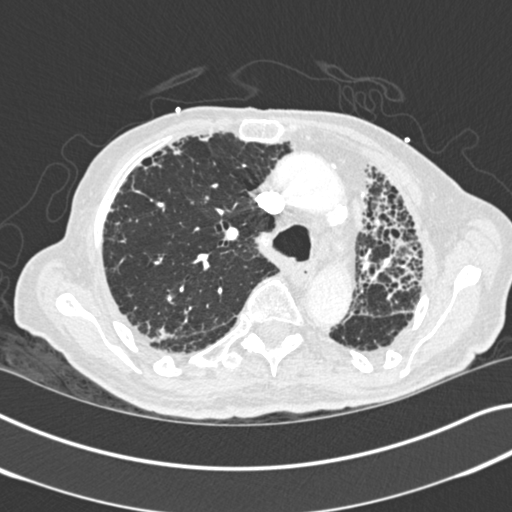
[im 215/298  mediastinal]
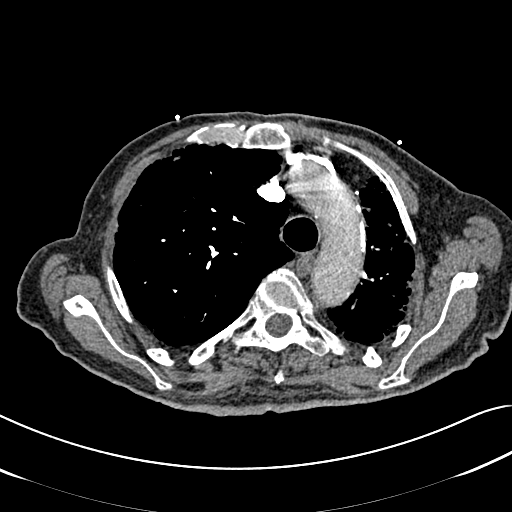
[im 232/298  lung]
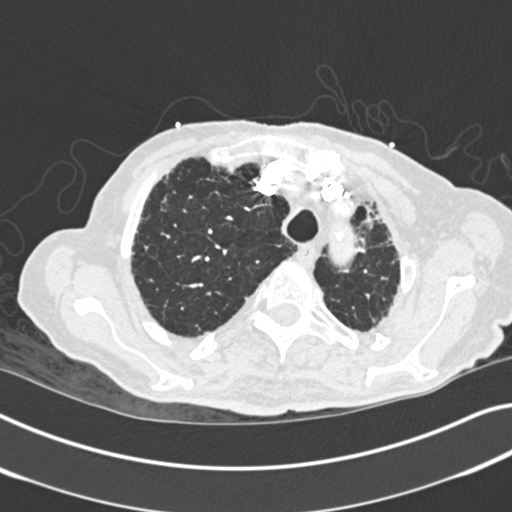
[im 248/298  mediastinal]
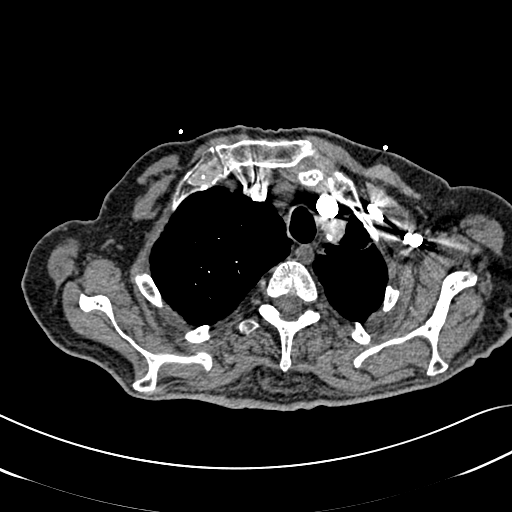
[im 265/298  lung]
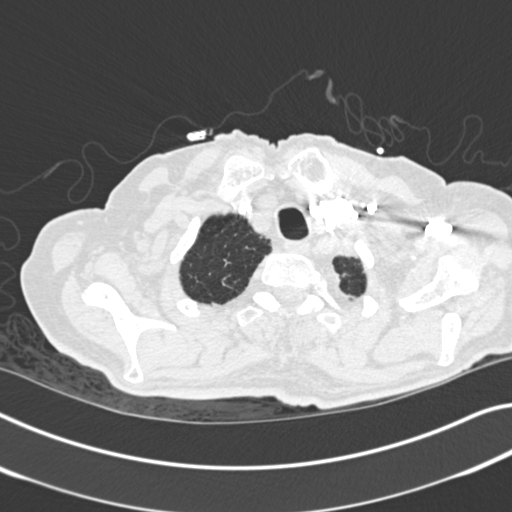
[im 281/298  mediastinal]
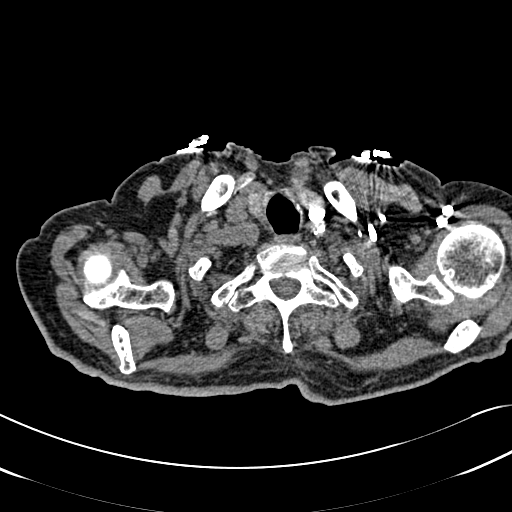

[18 of 30 positions shown; findings below may reference images not displayed]

FINDINGS: No filling defects in the pulmonary arteries to suggest pulmonary
emboli. Heart is normal size. Aorta is normal caliber. Densely
calcified coronary artery. Calcifications throughout the aorta. No
mediastinal, hilar, or axillary adenopathy.

Diffuse interstitial prominence throughout the left lung, with
similar peripheral interstitial prominence throughout the right
lung. Volume loss on the left with shift of mediastinal structures
to the left. No pleural effusion.

Chest wall soft tissues are unremarkable. Imaging into the upper
abdomen shows no acute findings. Layering gallstones within the
gallbladder.

Review of the MIP images confirms the above findings.
IMPRESSION: No evidence of pulmonary embolus.

Coronary artery disease.

Diffuse interstitial thickening throughout the left lung and
peripherally in the right lung. Volume loss on the left. I favor
this represents chronic interstitial lung disease and fibrosis.
Given the asymmetry, a superimposed acute process on the low left
cannot be excluded such as infection or less likely asymmetric
edema.

Cholelithiasis.
